# Patient Record
Sex: Male | Born: 1990 | Race: White | Hispanic: No | Marital: Single | Smoking: Current every day smoker
Health system: Southern US, Community
[De-identification: ages and names within clinical notes are randomized; demographics above are authoritative.]

## PROBLEM LIST (undated history)

## (undated) DIAGNOSIS — R011 Cardiac murmur, unspecified: Secondary | ICD-10-CM

## (undated) DIAGNOSIS — F1910 Other psychoactive substance abuse, uncomplicated: Secondary | ICD-10-CM

## (undated) DIAGNOSIS — Z720 Tobacco use: Secondary | ICD-10-CM

## (undated) DIAGNOSIS — S81801A Unspecified open wound, right lower leg, initial encounter: Secondary | ICD-10-CM

## (undated) HISTORY — PX: NO PAST SURGERIES: SHX2092

---
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
## 2011-07-14 ENCOUNTER — Encounter (HOSPITAL_COMMUNITY): Payer: Self-pay | Admitting: Emergency Medicine

## 2011-07-14 ENCOUNTER — Emergency Department (HOSPITAL_COMMUNITY)
Admission: EM | Admit: 2011-07-14 | Discharge: 2011-07-14 | Disposition: A | Discharge status: Home / Self Care | Payer: Self-pay | Attending: Emergency Medicine | Admitting: Emergency Medicine

## 2011-07-14 DIAGNOSIS — L0291 Cutaneous abscess, unspecified: Secondary | ICD-10-CM

## 2011-07-14 DIAGNOSIS — F17200 Nicotine dependence, unspecified, uncomplicated: Secondary | ICD-10-CM | POA: Insufficient documentation

## 2011-07-14 DIAGNOSIS — IMO0002 Reserved for concepts with insufficient information to code with codable children: Secondary | ICD-10-CM | POA: Insufficient documentation

## 2011-07-14 MED ORDER — SULFAMETHOXAZOLE-TRIMETHOPRIM 800-160 MG PO TABS: 1.0000 | ORAL_TABLET | Freq: Two times a day (BID) | ORAL | Status: DC

## 2011-07-14 MED ORDER — SULFAMETHOXAZOLE-TMP DS 800-160 MG PO TABS
1.0000 | ORAL_TABLET | Freq: Once | ORAL | Status: AC
  Administered 2011-07-14: 1 via ORAL
  Filled 2011-07-14: qty 1

## 2011-07-14 MED ORDER — OXYCODONE-ACETAMINOPHEN 5-325 MG PO TABS
1.0000 | ORAL_TABLET | Freq: Once | ORAL | Status: AC
  Administered 2011-07-14: 1 via ORAL
  Filled 2011-07-14: qty 1

## 2011-07-14 MED ORDER — OXYCODONE-ACETAMINOPHEN 5-325 MG PO TABS: 1.0000 | ORAL_TABLET | Freq: Four times a day (QID) | ORAL | Status: DC | PRN

## 2011-07-14 NOTE — ED Notes (Addendum)
Pt c/o abscess to left forearm onset Monday. Pt took his sister's penicillin yesterday. Pt also reports using a thumb tack and poked a hole in the boil. Area red in color with yellow center.

## 2011-07-14 NOTE — ED Provider Notes (Signed)
History  Scribed for Ethelda Chick, MD, the patient was seen in room TR10C/TR10C. This chart was scribed by Candelaria Stagers. The patient's care started at 3:58 PM   CSN: [id number]  Arrival date & time 07/14/11  1534   First MD Initiated Contact with Patient 07/14/11 1555      Chief Complaint  Patient presents with  . Abscess    left arm     The history is provided by the patient.   Rick Taylor is a 21 y.o. male who presents to the Emergency Department complaining of an abscess to his left forearmthat he noticed about five days ago and has gotten worse over the last two days.  Pt poked a hole in the abscess and covered with a band aid.  He is experiencing no other sx.     History reviewed. No pertinent past medical history.  History reviewed. No pertinent past surgical history.  History reviewed. No pertinent family history.  History  Substance Use Topics  . Smoking status: Current Everyday Smoker  . Smokeless tobacco: Not on file  . Alcohol Use: Yes      Review of Systems  Skin:       Abscess to the left elbow  All other systems reviewed and are negative.    Allergies  Review of patient's allergies indicates no known allergies.  Home Medications   Current Outpatient Rx  Name Route Sig Dispense Refill  . PENICILLIN V POTASSIUM 500 MG PO TABS Oral Take 500 mg by mouth 2 (two) times daily.    . OXYCODONE-ACETAMINOPHEN 5-325 MG PO TABS Oral Take 1-2 tablets by mouth every 6 (six) hours as needed for pain. 15 tablet 0  . SULFAMETHOXAZOLE-TRIMETHOPRIM 800-160 MG PO TABS Oral Take 1 tablet by mouth 2 (two) times daily. 28 tablet 0    BP 129/66  Pulse 71  Temp 98.5 F (36.9 C) (Oral)  Resp 18  SpO2 98%  Physical Exam  Nursing note and vitals reviewed. Constitutional: He is oriented to person, place, and time. He appears well-nourished.  HENT:  Head: Normocephalic and atraumatic.  Eyes: Pupils are equal, round, and reactive to light.  Pulmonary/Chest:  Effort normal.  Musculoskeletal: Normal range of motion. He exhibits no tenderness.  Neurological: He is alert and oriented to person, place, and time.  Skin: Skin is warm and dry.       2 cm area of erythema and induration with a central area of active drainage of purulent discharge on the left elbow.  Non fluctuant to palpation.    Psychiatric: He has a normal mood and affect. His behavior is normal.    ED Course  Procedures  DIAGNOSTIC STUDIES: Oxygen Saturation is 98% on room air, normal by my interpretation.    COORDINATION OF CARE:     Labs Reviewed - No data to display No results found.   1. Abscess       MDM  Pt presents with left forearm abscess which is draining prurulent fluid.  Area is firm and nonfluctuant surrounding.  I do not think I and D will be helpful at this time.  Pt was started on antibiotics, was instructed about warm soaks and advised to have area rechecked in 48 hours.  He is agreeable with this plan and did not want to proceed with I and D at this time.  Discharged with strict return precautions.  Pt agreeable with plan.   I personally performed the services described in this documentation, which  was scribed in my presence. The recorded information has been reviewed and considered.    Ethelda Chick, MD 07/15/11 1331

## 2011-07-24 ENCOUNTER — Emergency Department (HOSPITAL_COMMUNITY)
Admission: EM | Admit: 2011-07-24 | Discharge: 2011-07-24 | Disposition: A | Discharge status: Home / Self Care | Payer: Self-pay | Attending: Emergency Medicine | Admitting: Emergency Medicine

## 2011-07-24 ENCOUNTER — Encounter (HOSPITAL_COMMUNITY): Payer: Self-pay | Admitting: *Deleted

## 2011-07-24 DIAGNOSIS — L270 Generalized skin eruption due to drugs and medicaments taken internally: Secondary | ICD-10-CM | POA: Insufficient documentation

## 2011-07-24 DIAGNOSIS — R21 Rash and other nonspecific skin eruption: Secondary | ICD-10-CM

## 2011-07-24 DIAGNOSIS — F17200 Nicotine dependence, unspecified, uncomplicated: Secondary | ICD-10-CM | POA: Insufficient documentation

## 2011-07-24 DIAGNOSIS — IMO0002 Reserved for concepts with insufficient information to code with codable children: Secondary | ICD-10-CM | POA: Insufficient documentation

## 2011-07-24 NOTE — ED Notes (Signed)
PT has all over red rash that burns and has left posterior forearm abscess that just needs to be rechecked

## 2011-07-24 NOTE — ED Provider Notes (Signed)
History  This chart was scribed for No att. providers found by [facility] Day. This patient was seen in room TR07C/TR07C and the patient's care was started at 1353.   CSN: [id number]  Arrival date & time 07/24/11  1353   None     Chief Complaint  Patient presents with  . Rash  . Abscess    left arm    Patient is a 21 y.o. male presenting with abscess. The history is provided by the patient. No language interpreter was used.  Abscess    Rick Taylor is a 21 y.o. male who presents to the Emergency Department complaining of a red/itchy rash scattered all over his body that burns for one day. He states that he also has a previous abscess from 10 days ago on his left forearm that needs to be rechecked. He denies any SOB, or problems breathing as associated symptoms. He started antibiotics 10 days ago for abscess and almost done. Treated rash with ibuprofen and Benadryl, without relief History reviewed. No pertinent past medical history. Past medical history negative History reviewed. No pertinent past surgical history.  No family history on file.  History  Substance Use Topics  . Smoking status: Current Everyday Smoker  . Smokeless tobacco: Not on file  . Alcohol Use: Yes     occ      Review of Systems  Constitutional: Negative.   HENT: Negative.   Respiratory: Negative.   Cardiovascular: Negative.   Gastrointestinal: Negative.   Musculoskeletal: Negative.   Skin: Positive for rash (He states burning rash all over his body) and wound.  Neurological: Negative.   Hematological: Negative.   Psychiatric/Behavioral: Negative.     Allergies  Review of patient's allergies indicates no known allergies.  Home Medications   Current Outpatient Rx  Name Route Sig Dispense Refill  . OXYCODONE-ACETAMINOPHEN 5-325 MG PO TABS Oral Take 1-2 tablets by mouth every 6 (six) hours as needed for pain. 15 tablet 0  . PENICILLIN V POTASSIUM 500 MG PO TABS Oral Take 500 mg by mouth 2 (two)  times daily.    . SULFAMETHOXAZOLE-TRIMETHOPRIM 800-160 MG PO TABS Oral Take 1 tablet by mouth 2 (two) times daily. 28 tablet 0    Triage Vitals: BP 121/77  Pulse 89  Temp 98.8 F (37.1 C) (Oral)  Resp 18  SpO2 98%  Physical Exam  Nursing note and vitals reviewed. Constitutional: He is oriented to person, place, and time. He appears well-developed and well-nourished.  HENT:  Head: Normocephalic and atraumatic.  Eyes: Conjunctivae are normal. Pupils are equal, round, and reactive to light.  Neck: Neck supple. No tracheal deviation present. No thyromegaly present.  Cardiovascular: Normal rate and regular rhythm.   No murmur heard. Pulmonary/Chest: Effort normal and breath sounds normal.  Abdominal: Soft. Bowel sounds are normal. He exhibits no distension. There is no tenderness.  Musculoskeletal: Normal range of motion. He exhibits no edema and no tenderness.  Neurological: He is alert and oriented to person, place, and time. Coordination normal.  Skin: Skin is warm and dry. Rash noted.       Very faint pinkish morbilliform rash on bilateral arms and trunk not involving palms or soles. Rash is pruritic and burning no open lesions except for 2 mm open wound at left elbow where abscess was drained previously  Psychiatric: He has a normal mood and affect.    ED Course  Procedures (including critical care time) DIAGNOSTIC STUDIES: Oxygen Saturation is 98% on room air, normal by  my interpretation.    COORDINATION OF CARE: At 224 PM Discussed treatment plan with patient which includes stopping his antibiotic medication and possibly try Tylenol for his rash symptoms. Patient agrees.   Labs Reviewed - No data to display No results found.   No diagnosis found.    MDM  Abscess appears well healing patient states it feels steadily improve each day. Suspect drug rash. Plan stop Bactrim Diagnosis #1 healing abscess #2 drug rash  I personally performed the services described in this  documentation, which was scribed in my presence. The recorded information has been reviewed and considered.        Doug Sou, MD 07/24/11 1433

## 2013-07-26 ENCOUNTER — Encounter (HOSPITAL_COMMUNITY): Payer: Self-pay | Admitting: Emergency Medicine

## 2013-07-26 ENCOUNTER — Emergency Department (HOSPITAL_COMMUNITY)
Admission: EM | Admit: 2013-07-26 | Discharge: 2013-07-26 | Disposition: A | Discharge status: Home / Self Care | Payer: Self-pay | Attending: Emergency Medicine | Admitting: Emergency Medicine

## 2013-07-26 DIAGNOSIS — F17200 Nicotine dependence, unspecified, uncomplicated: Secondary | ICD-10-CM | POA: Insufficient documentation

## 2013-07-26 DIAGNOSIS — J039 Acute tonsillitis, unspecified: Secondary | ICD-10-CM

## 2013-07-26 DIAGNOSIS — R4182 Altered mental status, unspecified: Secondary | ICD-10-CM | POA: Insufficient documentation

## 2013-07-26 LAB — BASIC METABOLIC PANEL
Anion gap: 13 (ref 5–15)
BUN: 10 mg/dL (ref 6–23)
CALCIUM: 9 mg/dL (ref 8.4–10.5)
CO2: 25 mEq/L (ref 19–32)
Chloride: 96 mEq/L (ref 96–112)
Creatinine, Ser: 0.99 mg/dL (ref 0.50–1.35)
GFR calc Af Amer: >90 mL/min (ref >90)
Glucose, Bld: 104 mg/dL — ABNORMAL HIGH (ref 70–99)
POTASSIUM: 3.6 meq/L — AB (ref 3.7–5.3)
SODIUM: 134 meq/L — AB (ref 137–147)

## 2013-07-26 LAB — CBC WITH DIFFERENTIAL/PLATELET
BASOS ABS: 0 10*3/uL (ref 0.0–0.1)
BASOS PCT: 0 % (ref 0–1)
EOS PCT: 0 % (ref 0–5)
Eosinophils Absolute: 0 10*3/uL (ref 0.0–0.7)
HCT: 38.4 % — ABNORMAL LOW (ref 39.0–52.0)
Hemoglobin: 13 g/dL (ref 13.0–17.0)
Lymphocytes Relative: 10 % — ABNORMAL LOW (ref 12–46)
Lymphs Abs: 1.6 10*3/uL (ref 0.7–4.0)
MCH: 29.8 pg (ref 26.0–34.0)
MCHC: 33.9 g/dL (ref 30.0–36.0)
MCV: 88.1 fL (ref 78.0–100.0)
MONO ABS: 1.9 10*3/uL — AB (ref 0.1–1.0)
MONOS PCT: 11 % (ref 3–12)
NEUTROS ABS: 13.1 10*3/uL — AB (ref 1.7–7.7)
Neutrophils Relative %: 79 % — ABNORMAL HIGH (ref 43–77)
PLATELETS: 206 10*3/uL (ref 150–400)
RBC: 4.36 MIL/uL (ref 4.22–5.81)
RDW: 13 % (ref 11.5–15.5)
WBC: 16.6 10*3/uL — AB (ref 4.0–10.5)

## 2013-07-26 LAB — MONONUCLEOSIS SCREEN: MONO SCREEN: NEGATIVE

## 2013-07-26 LAB — RAPID STREP SCREEN (MED CTR MEBANE ONLY): STREPTOCOCCUS, GROUP A SCREEN (DIRECT): NEGATIVE

## 2013-07-26 MED ORDER — SODIUM CHLORIDE 0.9 % IV SOLN: 1000.0000 mL | INTRAVENOUS | Status: DC

## 2013-07-26 MED ORDER — FENTANYL CITRATE 0.05 MG/ML IJ SOLN
25.0000 ug | Freq: Once | INTRAMUSCULAR | Status: AC
  Administered 2013-07-26: 25 ug via INTRAVENOUS
  Filled 2013-07-26: qty 2

## 2013-07-26 MED ORDER — PENICILLIN V POTASSIUM 500 MG PO TABS: 500.0000 mg | ORAL_TABLET | Freq: Four times a day (QID) | ORAL | Status: DC

## 2013-07-26 MED ORDER — CLINDAMYCIN PHOSPHATE 900 MG/50ML IV SOLN
900.0000 mg | Freq: Once | INTRAVENOUS | Status: AC
  Administered 2013-07-26: 900 mg via INTRAVENOUS
  Filled 2013-07-26: qty 50

## 2013-07-26 MED ORDER — ONDANSETRON HCL 4 MG/2ML IJ SOLN
4.0000 mg | Freq: Once | INTRAMUSCULAR | Status: AC
  Administered 2013-07-26: 4 mg via INTRAVENOUS
  Filled 2013-07-26: qty 2

## 2013-07-26 MED ORDER — SODIUM CHLORIDE 0.9 % IV SOLN
1000.0000 mL | Freq: Once | INTRAVENOUS | Status: AC
  Administered 2013-07-26: 1000 mL via INTRAVENOUS

## 2013-07-26 MED ORDER — HYDROCODONE-ACETAMINOPHEN 7.5-325 MG/15ML PO SOLN: 15.0000 mL | Freq: Four times a day (QID) | ORAL | Status: AC | PRN

## 2013-07-26 MED ORDER — DEXAMETHASONE SODIUM PHOSPHATE 10 MG/ML IJ SOLN
10.0000 mg | Freq: Once | INTRAMUSCULAR | Status: AC
  Administered 2013-07-26: 10 mg via INTRAVENOUS
  Filled 2013-07-26: qty 1

## 2013-07-26 NOTE — ED Notes (Signed)
Pt reports sore throat, body aches, and vomiting x3 days. Vomited 1 hour ago. Pain 7/10.

## 2013-07-26 NOTE — ED Provider Notes (Signed)
CSN: [id number]     Arrival date & time 07/26/13  1300 History   First MD Initiated Contact with Patient 07/26/13 1325     Chief Complaint  Patient presents with  . Generalized Body Aches  . Sore Throat     (Consider location/radiation/quality/duration/timing/severity/associated sxs/prior Treatment) HPI Patient reports this is the third day he has had a sore throat with headache and diffuse myalgias. He is unsure of fever. He also complains of bilateral ear pain when he swallows. He has had nausea and vomiting that started last night. He states he's vomited about 6 times. He's had 2 episodes of diarrhea. He has some abdominal cramping that comes and goes. He states he feels weak, dizzy and has some swelling in his neck. He denies any shortness of breath or difficulty swallowing although it hurts to swallow. He denies being around anybody else who is ill. He does not have a history of frequent throat infections.  PCP none  History reviewed. No pertinent past medical history. History reviewed. No pertinent past surgical history. History reviewed. No pertinent family history. History  Substance Use Topics  . Smoking status: Current Every Day Smoker  . Smokeless tobacco: Not on file  . Alcohol Use: Yes     Comment: occ  patient is unemployed He smokes one half to one pack per day  Review of Systems  All other systems reviewed and are negative.     Allergies  Review of patient's allergies indicates no known allergies.  Home Medications   Prior to Admission medications   Medication Sig Start Date End Date Taking? Authorizing Provider  ibuprofen (ADVIL,MOTRIN) 200 MG tablet Take 400 mg by mouth every 6 (six) hours as needed for moderate pain.   Yes Historical Provider, MD  HYDROcodone-acetaminophen (HYCET) 7.5-325 mg/15 ml solution Take 15 mLs by mouth 4 (four) times daily as needed for moderate pain. 07/26/13 07/26/14  Ward GivensIva L Chrisma Hurlock, MD  penicillin v potassium (VEETID) 500 MG tablet  Take 1 tablet (500 mg total) by mouth 4 (four) times daily. 07/26/13   Ward GivensIva L Sonakshi Rolland, MD   BP 117/77  Pulse 86  Temp(Src) 99 F (37.2 C) (Oral)  Resp 16  SpO2 99%  Vital signs normal except low-grade temp  Physical Exam  Nursing note and vitals reviewed. Constitutional: He is oriented to person, place, and time. He appears well-developed and well-nourished.  Non-toxic appearance. He does not appear ill. No distress.  HENT:  Head: Normocephalic and atraumatic.  Right Ear: Hearing, tympanic membrane, external ear and ear canal normal.  Left Ear: Hearing, tympanic membrane, external ear and ear canal normal.  Nose: Nose normal. No mucosal edema or rhinorrhea.  Mouth/Throat: Mucous membranes are normal. No dental abscesses or uvula swelling. Oropharyngeal exudate and posterior oropharyngeal erythema present.  Patient has diffuse redness  Of his tonsils and posterior pharynx with scattered exudates. His uvula is midline. There is no soft palate swelling seen. Patient's voice is normal. He is not spitting in a cup. He is in no respiratory distress.  Eyes: Conjunctivae and EOM are normal. Pupils are equal, round, and reactive to light.  Neck: Normal range of motion and full passive range of motion without pain. Neck supple.  Patient has some shotty lymphadenopathy that extends down into the supraclavicular area on the left. The are only in the cervical area proximally on the right. There are no epitrochlear nodes.  Cardiovascular: Normal rate, regular rhythm and normal heart sounds.  Exam reveals no gallop and  no friction rub.   No murmur heard. Pulmonary/Chest: Effort normal and breath sounds normal. No respiratory distress. He has no wheezes. He has no rhonchi. He has no rales. He exhibits no tenderness and no crepitus.  Abdominal: Soft. Normal appearance and bowel sounds are normal. He exhibits no distension. There is no tenderness. There is no rebound and no guarding.  Musculoskeletal: Normal  range of motion. He exhibits no edema and no tenderness.  Moves all extremities well.   Neurological: He is alert and oriented to person, place, and time. He has normal strength. No cranial nerve deficit.  Skin: Skin is warm, dry and intact. No rash noted. No erythema. No pallor.  Psychiatric: He has a normal mood and affect. His speech is normal and behavior is normal. His mood appears not anxious.    ED Course  Procedures (including critical care time)  Medications  0.9 %  sodium chloride infusion (1,000 mLs Intravenous New Bag/Given 07/26/13 1455)    Followed by  0.9 %  sodium chloride infusion (0 mLs Intravenous Stopped 07/26/13 1608)    Followed by  0.9 %  sodium chloride infusion (not administered)  ondansetron (ZOFRAN) injection 4 mg (4 mg Intravenous Given 07/26/13 1455)  clindamycin (CLEOCIN) IVPB 900 mg (0 mg Intravenous Stopped 07/26/13 1525)  dexamethasone (DECADRON) injection 10 mg (10 mg Intravenous Given 07/26/13 1456)  fentaNYL (SUBLIMAZE) injection 25 mcg (25 mcg Intravenous Given 07/26/13 1455)    Patient refused to get Bicillin IM. He was given IV fluids and IV antibiotics. He was given one dose of steroids and pain and nausea medication and his IV.  Recheck at 1615 patient has received 1 L of IV fluid. He states he's feeling better. He is starting to get a urinary output.  Labs Review  Results for orders placed during the hospital encounter of 07/26/13  RAPID STREP SCREEN      Result Value Ref Range   Streptococcus, Group A Screen (Direct) NEGATIVE  NEGATIVE  CBC WITH DIFFERENTIAL      Result Value Ref Range   WBC 16.6 (*) 4.0 - 10.5 K/uL   RBC 4.36  4.22 - 5.81 MIL/uL   Hemoglobin 13.0  13.0 - 17.0 g/dL   HCT 40.9 (*) 81.1 - 91.4 %   MCV 88.1  78.0 - 100.0 fL   MCH 29.8  26.0 - 34.0 pg   MCHC 33.9  30.0 - 36.0 g/dL   RDW 78.2  95.6 - 21.3 %   Platelets 206  150 - 400 K/uL   Neutrophils Relative % 79 (*) 43 - 77 %   Neutro Abs 13.1 (*) 1.7 - 7.7 K/uL    Lymphocytes Relative 10 (*) 12 - 46 %   Lymphs Abs 1.6  0.7 - 4.0 K/uL   Monocytes Relative 11  3 - 12 %   Monocytes Absolute 1.9 (*) 0.1 - 1.0 K/uL   Eosinophils Relative 0  0 - 5 %   Eosinophils Absolute 0.0  0.0 - 0.7 K/uL   Basophils Relative 0  0 - 1 %   Basophils Absolute 0.0  0.0 - 0.1 K/uL  BASIC METABOLIC PANEL      Result Value Ref Range   Sodium 134 (*) 137 - 147 mEq/L   Potassium 3.6 (*) 3.7 - 5.3 mEq/L   Chloride 96  96 - 112 mEq/L   CO2 25  19 - 32 mEq/L   Glucose, Bld 104 (*) 70 - 99 mg/dL   BUN 10  6 - 23 mg/dL   Creatinine, Ser 1.61  0.50 - 1.35 mg/dL   Calcium 9.0  8.4 - 09.6 mg/dL   GFR calc non Af Amer >90  >90 mL/min   GFR calc Af Amer >90  >90 mL/min   Anion gap 13  5 - 15  MONONUCLEOSIS SCREEN      Result Value Ref Range   Mono Screen NEGATIVE  NEGATIVE   Laboratory interpretation all normal except leukocytosis     Imaging Review No results found.   EKG Interpretation None      MDM   Final diagnoses:  Acute tonsillitis     New Prescriptions   HYDROCODONE-ACETAMINOPHEN (HYCET) 7.5-325 MG/15 ML SOLUTION    Take 15 mLs by mouth 4 (four) times daily as needed for moderate pain.   PENICILLIN V POTASSIUM (VEETID) 500 MG TABLET    Take 1 tablet (500 mg total) by mouth 4 (four) times daily.    Plan discharge  Devoria Albe, MD, Franz Dell, MD 07/26/13 [phone]

## 2013-07-26 NOTE — Discharge Instructions (Signed)
Drink plenty of fluids. Take the antibiotics until gone. Use the hycet for severe pain. You can take ibuprofen 600 mg (you can take the liquid childrens if you can't swallow the pills) every 6 hrs for pain or fever. Once the hycet is gone you can take acetaminophen 1000 mg 4 times a day for pain or fever as needed. Return to the ED if you are unable to swallow or you start having trouble breathing.  You need to take the penicillin pills until gone.  Tonsillitis Tonsillitis is an infection of the throat that causes the tonsils to become red, tender, and swollen. Tonsils are collections of lymphoid tissue at the back of the throat. Each tonsil has crevices (crypts). Tonsils help fight nose and throat infections and keep infection from spreading to other parts of the body for the first 18 months of life.  CAUSES Sudden (acute) tonsillitis is usually caused by infection with streptococcal bacteria. Long-lasting (chronic) tonsillitis occurs when the crypts of the tonsils become filled with pieces of food and bacteria, which makes it easy for the tonsils to become repeatedly infected. SYMPTOMS  Symptoms of tonsillitis include:  A sore throat, with possible difficulty swallowing.  White patches on the tonsils.  Fever.  Tiredness.  New episodes of snoring during sleep, when you did not snore before.  Small, foul-smelling, yellowish-white pieces of material (tonsilloliths) that you occasionally cough up or spit out. The tonsilloliths can also cause you to have bad breath. DIAGNOSIS Tonsillitis can be diagnosed through a physical exam. Diagnosis can be confirmed with the results of lab tests, including a throat culture. TREATMENT  The goals of tonsillitis treatment include the reduction of the severity and duration of symptoms and prevention of associated conditions. Symptoms of tonsillitis can be improved with the use of steroids to reduce the swelling. Tonsillitis caused by bacteria can be treated with  antibiotic medicines. Usually, treatment with antibiotic medicines is started before the cause of the tonsillitis is known. However, if it is determined that the cause is not bacterial, antibiotic medicines will not treat the tonsillitis. If attacks of tonsillitis are severe and frequent, your health care provider may recommend surgery to remove the tonsils (tonsillectomy). HOME CARE INSTRUCTIONS   Rest as much as possible and get plenty of sleep.  Drink plenty of fluids. While the throat is very sore, eat soft foods or liquids, such as sherbet, soups, or instant breakfast drinks.  Eat frozen ice pops.  Gargle with a warm or cold liquid to help soothe the throat. Mix 1/4 teaspoon of salt and 1/4 teaspoon of baking soda in 8 oz of water. SEEK MEDICAL CARE IF:   Large, tender lumps develop in your neck.  A rash develops.  A green, yellow-brown, or bloody substance is coughed up.  You are unable to swallow liquids or food for 24 hours.  You notice that only one of the tonsils is swollen. SEEK IMMEDIATE MEDICAL CARE IF:   You develop any new symptoms such as vomiting, severe headache, stiff neck, chest pain, or trouble breathing or swallowing.  You have severe throat pain along with drooling or voice changes.  You have severe pain, unrelieved with recommended medications.  You are unable to fully open the mouth.  You develop redness, swelling, or severe pain anywhere in the neck.  You have a fever. MAKE SURE YOU:   Understand these instructions.  Will watch your condition.  Will get help right away if you are not doing well or get  worse. Document Released: 09/27/2004 Document Revised: 05/04/2013 Document Reviewed: 06/06/2012 [facility] Patient Information 2015 [employer] [place]. This information is not intended to replace advice given to you by your health care provider. Make sure you discuss any questions you have with your health care provider.

## 2013-07-28 LAB — CULTURE, GROUP A STREP

## 2014-10-02 ENCOUNTER — Emergency Department (HOSPITAL_COMMUNITY): Payer: Self-pay

## 2014-10-02 ENCOUNTER — Emergency Department (HOSPITAL_COMMUNITY)
Admission: EM | Admit: 2014-10-02 | Discharge: 2014-10-02 | Disposition: A | Discharge status: Home / Self Care | Payer: Self-pay | Attending: Emergency Medicine | Admitting: Emergency Medicine

## 2014-10-02 ENCOUNTER — Encounter (HOSPITAL_COMMUNITY): Payer: Self-pay | Admitting: Emergency Medicine

## 2014-10-02 DIAGNOSIS — R000 Tachycardia, unspecified: Secondary | ICD-10-CM | POA: Insufficient documentation

## 2014-10-02 DIAGNOSIS — Z720 Tobacco use: Secondary | ICD-10-CM | POA: Insufficient documentation

## 2014-10-02 DIAGNOSIS — Z792 Long term (current) use of antibiotics: Secondary | ICD-10-CM | POA: Insufficient documentation

## 2014-10-02 DIAGNOSIS — B349 Viral infection, unspecified: Secondary | ICD-10-CM | POA: Insufficient documentation

## 2014-10-02 LAB — BASIC METABOLIC PANEL
Anion gap: 5 (ref 5–15)
BUN: 10 mg/dL (ref 6–20)
CO2: 30 mmol/L (ref 22–32)
CREATININE: 0.89 mg/dL (ref 0.61–1.24)
Calcium: 9 mg/dL (ref 8.9–10.3)
Chloride: 98 mmol/L — ABNORMAL LOW (ref 101–111)
GFR calc Af Amer: >60 mL/min (ref >60)
Glucose, Bld: 113 mg/dL — ABNORMAL HIGH (ref 65–99)
Potassium: 3.3 mmol/L — ABNORMAL LOW (ref 3.5–5.1)
SODIUM: 133 mmol/L — AB (ref 135–145)

## 2014-10-02 LAB — I-STAT CG4 LACTIC ACID, ED: LACTIC ACID, VENOUS: 1.08 mmol/L (ref 0.5–2.0)

## 2014-10-02 LAB — CBC WITH DIFFERENTIAL/PLATELET
Basophils Absolute: 0 10*3/uL (ref 0.0–0.1)
Basophils Relative: 0 %
EOS ABS: 0 10*3/uL (ref 0.0–0.7)
EOS PCT: 0 %
HCT: 41 % (ref 39.0–52.0)
Hemoglobin: 13.8 g/dL (ref 13.0–17.0)
LYMPHS ABS: 0.4 10*3/uL — AB (ref 0.7–4.0)
Lymphocytes Relative: 3 %
MCH: 29.1 pg (ref 26.0–34.0)
MCHC: 33.7 g/dL (ref 30.0–36.0)
MCV: 86.5 fL (ref 78.0–100.0)
MONOS PCT: 0 %
Monocytes Absolute: 0.1 10*3/uL (ref 0.1–1.0)
Neutro Abs: 13 10*3/uL — ABNORMAL HIGH (ref 1.7–7.7)
Neutrophils Relative %: 97 %
PLATELETS: 238 10*3/uL (ref 150–400)
RBC: 4.74 MIL/uL (ref 4.22–5.81)
RDW: 13.2 % (ref 11.5–15.5)
WBC: 13.4 10*3/uL — AB (ref 4.0–10.5)

## 2014-10-02 LAB — RAPID STREP SCREEN (MED CTR MEBANE ONLY): Streptococcus, Group A Screen (Direct): NEGATIVE

## 2014-10-02 MED ORDER — ACETAMINOPHEN 500 MG PO TABS
1000.0000 mg | ORAL_TABLET | Freq: Once | ORAL | Status: AC
  Administered 2014-10-02: 1000 mg via ORAL

## 2014-10-02 MED ORDER — IBUPROFEN 800 MG PO TABS: 800.0000 mg | ORAL_TABLET | Freq: Three times a day (TID) | ORAL | Status: DC

## 2014-10-02 MED ORDER — ACETAMINOPHEN 500 MG PO TABS
ORAL_TABLET | ORAL | Status: AC
  Filled 2014-10-02: qty 2

## 2014-10-02 MED ORDER — SODIUM CHLORIDE 0.9 % IV BOLUS (SEPSIS)
1000.0000 mL | Freq: Once | INTRAVENOUS | Status: AC
  Administered 2014-10-02: 1000 mL via INTRAVENOUS

## 2014-10-02 MED ORDER — KETOROLAC TROMETHAMINE 30 MG/ML IJ SOLN
30.0000 mg | Freq: Once | INTRAMUSCULAR | Status: AC
  Administered 2014-10-02: 30 mg via INTRAVENOUS
  Filled 2014-10-02: qty 1

## 2014-10-02 MED ORDER — CETIRIZINE HCL 10 MG PO TABS: 10.0000 mg | ORAL_TABLET | Freq: Every day | ORAL | Status: DC
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
## 2014-10-02 NOTE — ED Provider Notes (Signed)
CSN: [id number]     Arrival date & time 10/02/14  0043 History   First MD Initiated Contact with Patient 10/02/14 0051     Chief Complaint  Patient presents with  . flu like symptoms      (Consider location/radiation/quality/duration/timing/severity/associated sxs/prior Treatment) HPI Comments: Patient is a 24 year old male who presents with multiple complaints at this time. He reports having gradual onset of body aches, subjective fever, chills, and sore throat that started today. Patient tried benadryl for his symptoms which provided no relief. No aggravating/alleviating factors. No other associated symptoms. 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   Patient is a 24 y.o. male presenting with pharyngitis. The history is provided by the patient. No language interpreter was used.  Sore Throat This is a new problem. The current episode started today. The problem occurs constantly. The problem has been unchanged. Associated symptoms include chills, a fever, myalgias and a sore throat. Nothing aggravates the symptoms. He has tried nothing for the symptoms. The treatment provided no relief.    History reviewed. No pertinent past medical history. History reviewed. No pertinent past surgical history. No family history on file. Social History  Substance Use Topics  . Smoking status: Current Every Day Smoker  . Smokeless tobacco: None  . Alcohol Use: Yes     Comment: occ    Review of Systems  Constitutional: Positive for fever and chills.  HENT: Positive for sore throat.   Musculoskeletal: Positive for myalgias.  All other systems reviewed and are negative.     Allergies  Review of patient's allergies indicates no known allergies.  Home Medications   Prior to Admission medications   Medication Sig Start Date End Date Taking? Authorizing Provider  ibuprofen (ADVIL,MOTRIN) 200 MG tablet Take 400 mg by mouth every 6 (six) hours as needed for moderate pain.     Historical Provider, MD  penicillin v potassium (VEETID) 500 MG tablet Take 1 tablet (500 mg total) by mouth 4 (four) times daily. 07/26/13   Devoria Albe, MD   BP 134/80 mmHg  Pulse 114  Temp(Src) 101.5 F (38.6 C) (Oral)  Resp 20  SpO2 98% Physical Exam  Constitutional: He is oriented to person, place, and time. He appears well-developed and well-nourished. No distress.  HENT:  Head: Normocephalic and atraumatic.  Posterior oropharynx erythematous without tonsillar edema or exudate.   Eyes: Conjunctivae and EOM are normal.  Neck: Normal range of motion.  Cardiovascular: Regular rhythm.  Exam reveals no gallop and no friction rub.   No murmur heard. tachycardic  Pulmonary/Chest: Effort normal and breath sounds normal. He has no wheezes. He has no rales. He exhibits no tenderness.  Abdominal: Soft. He exhibits no distension. There is no tenderness. There is no rebound.  Musculoskeletal: Normal range of motion.  Neurological: He is alert and oriented to person, place, and time. Coordination normal.  Speech is goal-oriented. Moves limbs without ataxia.   Skin: Skin is warm and dry.  Psychiatric: He has a normal mood and affect. His behavior is normal.  Nursing note and vitals reviewed.   ED Course  Procedures (including critical care time) Labs Review Labs Reviewed  CBC WITH DIFFERENTIAL/PLATELET - Abnormal; Notable for the following:    WBC 13.4 (*)    Neutro Abs 13.0 (*)    Lymphs Abs 0.4 (*)    All other components within normal limits  BASIC METABOLIC PANEL - Abnormal; Notable for the following:    Sodium  133 (*)    Potassium 3.3 (*)    Chloride 98 (*)    Glucose, Bld 113 (*)    All other components within normal limits  RAPID STREP SCREEN (NOT AT [facility])  CULTURE, GROUP A STREP  CULTURE, BLOOD (ROUTINE X 2)  CULTURE, BLOOD (ROUTINE X 2)  I-STAT CG4 LACTIC ACID, ED    Imaging Review Dg Chest 2 View  10/02/2014   CLINICAL DATA:  Sore throat and flu-like symptoms.   EXAM: CHEST  2 VIEW  COMPARISON:  None.  FINDINGS: Normal heart size and mediastinal contours. No acute infiltrate or edema. No effusion or pneumothorax. No acute osseous findings.  IMPRESSION: Negative chest.   Electronically Signed   By: Marnee Spring M.D.   On: 10/02/2014 01:14   I have personally reviewed and evaluated these images and lab results as part of my medical decision-making.   EKG Interpretation None      MDM   Final diagnoses:  Viral illness    2:00 AM Patient's labs show elevated WBC at 13.4. Other labs show no acute changes. Patient is febrile and tachycardic at this time. He last used heroin a few hours ago.   Patient's aunt refused outpatient resources for substance abuse because "they all cost money." patient feeling better after fluids and toradol. Patient will be discharged with ibuprofen and zyrtec. Patient likely has a viral illness.     Emilia Beck, PA-C 10/02/14 0449  Lorre Nick, MD 10/05/14 [phone]

## 2014-10-02 NOTE — Discharge Instructions (Signed)
Take ibuprofen as needed for fever and body aches. Take zyrtec as needed for congestion and sore throat. Refer to attached documents for more information.

## 2014-10-05 LAB — CULTURE, GROUP A STREP

## 2014-10-07 LAB — CULTURE, BLOOD (ROUTINE X 2)
CULTURE: NO GROWTH
Culture: NO GROWTH

## 2020-02-16 ENCOUNTER — Other Ambulatory Visit: Payer: Self-pay

## 2020-02-16 ENCOUNTER — Emergency Department (HOSPITAL_COMMUNITY)
Admission: EM | Admit: 2020-02-16 | Discharge: 2020-02-16 | Payer: Self-pay | Attending: Emergency Medicine | Admitting: Emergency Medicine

## 2020-02-16 ENCOUNTER — Emergency Department (HOSPITAL_COMMUNITY): Payer: Self-pay

## 2020-02-16 ENCOUNTER — Encounter (HOSPITAL_COMMUNITY): Payer: Self-pay | Admitting: Emergency Medicine

## 2020-02-16 DIAGNOSIS — S81801A Unspecified open wound, right lower leg, initial encounter: Secondary | ICD-10-CM | POA: Insufficient documentation

## 2020-02-16 DIAGNOSIS — Z79899 Other long term (current) drug therapy: Secondary | ICD-10-CM | POA: Insufficient documentation

## 2020-02-16 DIAGNOSIS — F17200 Nicotine dependence, unspecified, uncomplicated: Secondary | ICD-10-CM | POA: Insufficient documentation

## 2020-02-16 DIAGNOSIS — W278XXA Contact with other nonpowered hand tool, initial encounter: Secondary | ICD-10-CM | POA: Insufficient documentation

## 2020-02-16 DIAGNOSIS — Z23 Encounter for immunization: Secondary | ICD-10-CM | POA: Insufficient documentation

## 2020-02-16 LAB — AEROBIC CULTURE W GRAM STAIN (SUPERFICIAL SPECIMEN)

## 2020-02-16 LAB — CBC WITH DIFFERENTIAL/PLATELET
Abs Immature Granulocytes: 0.03 10*3/uL (ref 0.00–0.07)
Basophils Absolute: 0.1 10*3/uL (ref 0.0–0.1)
Basophils Relative: 1 %
Eosinophils Absolute: 0.1 10*3/uL (ref 0.0–0.5)
Eosinophils Relative: 1 %
HCT: 36.5 % — ABNORMAL LOW (ref 39.0–52.0)
Hemoglobin: 11.6 g/dL — ABNORMAL LOW (ref 13.0–17.0)
Immature Granulocytes: 0 %
Lymphocytes Relative: 20 %
Lymphs Abs: 1.9 10*3/uL (ref 0.7–4.0)
MCH: 27.5 pg (ref 26.0–34.0)
MCHC: 31.8 g/dL (ref 30.0–36.0)
MCV: 86.5 fL (ref 80.0–100.0)
Monocytes Absolute: 0.8 10*3/uL (ref 0.1–1.0)
Monocytes Relative: 8 %
Neutro Abs: 6.5 10*3/uL (ref 1.7–7.7)
Neutrophils Relative %: 70 %
Platelets: 599 10*3/uL — ABNORMAL HIGH (ref 150–400)
RBC: 4.22 MIL/uL (ref 4.22–5.81)
RDW: 13.4 % (ref 11.5–15.5)
WBC: 9.4 10*3/uL (ref 4.0–10.5)
nRBC: 0 % (ref 0.0–0.2)

## 2020-02-16 LAB — COMPREHENSIVE METABOLIC PANEL
ALT: 20 U/L (ref 0–44)
AST: 26 U/L (ref 15–41)
Albumin: 3.7 g/dL (ref 3.5–5.0)
Alkaline Phosphatase: 64 U/L (ref 38–126)
Anion gap: 9 (ref 5–15)
BUN: 12 mg/dL (ref 6–20)
CO2: 28 mmol/L (ref 22–32)
Calcium: 8.9 mg/dL (ref 8.9–10.3)
Chloride: 100 mmol/L (ref 98–111)
Creatinine, Ser: 0.76 mg/dL (ref 0.61–1.24)
GFR, Estimated: >60 mL/min (ref >60)
Glucose, Bld: 89 mg/dL (ref 70–99)
Potassium: 3.9 mmol/L (ref 3.5–5.1)
Sodium: 137 mmol/L (ref 135–145)
Total Bilirubin: 0.3 mg/dL (ref 0.3–1.2)
Total Protein: 8.8 g/dL — ABNORMAL HIGH (ref 6.5–8.1)

## 2020-02-16 LAB — LACTIC ACID, PLASMA: Lactic Acid, Venous: 1.1 mmol/L (ref 0.5–1.9)

## 2020-02-16 MED ORDER — CLINDAMYCIN PHOSPHATE 600 MG/4ML IJ SOLN: 600.0000 mg | Freq: Once | INTRAMUSCULAR | Status: DC

## 2020-02-16 MED ORDER — CLINDAMYCIN HCL 300 MG PO CAPS: 300.0000 mg | ORAL_CAPSULE | Freq: Four times a day (QID) | ORAL | 0 refills | Status: AC

## 2020-02-16 MED ORDER — CLINDAMYCIN PHOSPHATE 900 MG/50ML IV SOLN
900.0000 mg | Freq: Once | INTRAVENOUS | Status: AC
  Administered 2020-02-16: 900 mg via INTRAVENOUS
  Filled 2020-02-16: qty 50

## 2020-02-16 MED ORDER — TETANUS-DIPHTH-ACELL PERTUSSIS 5-2.5-18.5 LF-MCG/0.5 IM SUSY
0.5000 mL | PREFILLED_SYRINGE | Freq: Once | INTRAMUSCULAR | Status: AC
  Administered 2020-02-16: 0.5 mL via INTRAMUSCULAR
  Filled 2020-02-16: qty 0.5

## 2020-02-16 NOTE — ED Notes (Addendum)
An After Visit Summary was printed and given to the patient. Discharge instructions given and no further questions at this time.  Pt leaving with police escort. Pt given new socks.

## 2020-02-16 NOTE — ED Triage Notes (Signed)
Under police custody-states he has an old right lower leg wound as a result of patient hitting his leg with a claw hammer-states his wound is infected

## 2020-02-16 NOTE — ED Provider Notes (Signed)
[facility]-EMERGENCY DEPT Provider Note   CSN: [id number] Arrival date & time: 02/16/20  1135     History Chief Complaint  Patient presents with  . Leg Pain    Rick Taylor is a 30 y.o. male.  30 year old male with history of polysubstance abuse brought in by police with right lower leg wound.  Patient states the hit his leg with a claw hammer approximately 3 months ago, did not seek wound care at that time and has been neglecting the wound since.  Patient was picked up today after failure to appear and was brought in by police for medical attention for his right lower leg wound.  Patient reports sensation of his foot, is able to bear weight without difficulty.  No other complaints or concerns today.        History reviewed. No pertinent past medical history.  There are no problems to display for this patient.   History reviewed. No pertinent surgical history.     No family history on file.  Social History   Tobacco Use  . Smoking status: Current Every Day Smoker  Substance Use Topics  . Alcohol use: Yes    Comment: occ  . Drug use: Yes    Types: IV    Home Medications Prior to Admission medications   Medication Sig Start Date End Date Taking? Authorizing Provider  clindamycin (CLEOCIN) 300 MG capsule Take 1 capsule (300 mg total) by mouth 4 (four) times daily for 10 days. 02/16/20 02/26/20 Yes Jeannie Fend, PA-C  cetirizine (ZYRTEC ALLERGY) 10 MG tablet Take 1 tablet (10 mg total) by mouth daily. 10/02/14   Emilia Beck, PA-C  ibuprofen (ADVIL,MOTRIN) 800 MG tablet Take 1 tablet (800 mg total) by mouth 3 (three) times daily. 10/02/14   Szekalski, Yvonna Alanis, PA-C  penicillin v potassium (VEETID) 500 MG tablet Take 1 tablet (500 mg total) by mouth 4 (four) times daily. 07/26/13   Devoria Albe, MD    Allergies    Patient has no known allergies.  Review of Systems   Review of Systems  Constitutional: Negative for fever.  Musculoskeletal:  Negative for arthralgias and myalgias.  Skin: Positive for wound.  Allergic/Immunologic: Negative for immunocompromised state.  Neurological: Negative for weakness and numbness.    Physical Exam Updated Vital Signs BP 109/71   Pulse 89   Temp 98 F (36.7 C) (Oral)   Resp 18   SpO2 100%   Physical Exam Vitals and nursing note reviewed.  Constitutional:      General: He is not in acute distress.    Appearance: He is well-developed and well-nourished. He is not diaphoretic.  HENT:     Head: Normocephalic and atraumatic.  Cardiovascular:     Pulses: Normal pulses.  Pulmonary:     Effort: Pulmonary effort is normal.  Musculoskeletal:        General: Tenderness present. No deformity.  Skin:    General: Skin is warm and dry.     Comments: Large wound to anterior right lower leg approximately 10cm x 15cm  Neurological:     Mental Status: He is alert and oriented to person, place, and time.     Sensory: No sensory deficit.  Psychiatric:        Mood and Affect: Mood and affect normal.        Behavior: Behavior normal.                ED Results / Procedures / Treatments  Labs (all labs ordered are listed, but only abnormal results are displayed) Labs Reviewed  COMPREHENSIVE METABOLIC PANEL - Abnormal; Notable for the following components:      Result Value   Total Protein 8.8 (*)    All other components within normal limits  CBC WITH DIFFERENTIAL/PLATELET - Abnormal; Notable for the following components:   Hemoglobin 11.6 (*)    HCT 36.5 (*)    Platelets 599 (*)    All other components within normal limits  AEROBIC CULTURE W GRAM STAIN (SUPERFICIAL SPECIMEN)  LACTIC ACID, PLASMA  URINALYSIS, ROUTINE W REFLEX MICROSCOPIC    EKG None  Radiology DG Tibia/Fibula Right  Result Date: 02/16/2020 CLINICAL DATA:  Right lower leg wound EXAM: RIGHT TIBIA AND FIBULA - 2 VIEW COMPARISON:  None. FINDINGS: No acute bony abnormality. Specifically, no fracture,  subluxation, or dislocation. No bone destruction. No radiopaque foreign bodies. IMPRESSION: No acute bony abnormality. Electronically Signed   By: Charlett Nose M.D.   On: 02/16/2020 12:57    Procedures Procedures   Medications Ordered in ED Medications  Tdap (BOOSTRIX) injection 0.5 mL (0.5 mLs Intramuscular Given 02/16/20 1255)  clindamycin (CLEOCIN) IVPB 900 mg (0 mg Intravenous Stopped 02/16/20 1341)    ED Course  I have reviewed the triage vital signs and the nursing notes.  Pertinent labs & imaging results that were available during my care of the patient were reviewed by me and considered in my medical decision making (see chart for details).  Clinical Course as of 02/16/20 1352  Tue Feb 16, 2020  1350 30 yo male brought in by police for medical clearance for right leg wound as above. XR without bony involvement. Labs reassuring including CBC, CMP, lactic acid. Discussed with Dr. Effie Shy, ER attending, plan is to give IM abx and dc on abx. Patient will be leaving with officers today to go to jail. Rx for clindamycin printed with his papers, referral to wound care given, td updated.  [LM]    Clinical Course User Index [LM] Alden Hipp   MDM Rules/Calculators/A&P                          Final Clinical Impression(s) / ED Diagnoses Final diagnoses:  Leg wound, right, initial encounter    Rx / DC Orders ED Discharge Orders         Ordered    clindamycin (CLEOCIN) 300 MG capsule  4 times daily        02/16/20 1307           Jeannie Fend, PA-C 02/16/20 1352    Mancel Bale, MD 02/16/20 1622

## 2020-02-16 NOTE — ED Notes (Signed)
Wet to dry dressing with xeroform applied to patients wound to right calf as ordered by Army Melia, PA.

## 2020-02-16 NOTE — Discharge Instructions (Addendum)
Take antibiotics as prescribed and complete the full course. Follow-up with wound clinic, call to schedule appointment.

## 2020-02-17 LAB — AEROBIC CULTURE W GRAM STAIN (SUPERFICIAL SPECIMEN)

## 2020-02-19 LAB — AEROBIC CULTURE W GRAM STAIN (SUPERFICIAL SPECIMEN): Gram Stain: NONE SEEN

## 2020-02-20 NOTE — Progress Notes (Signed)
ED Antimicrobial Stewardship Positive Culture Follow Up   Rick Taylor is an 30 y.o. male who presented to [facility] on 02/16/2020 with a chief complaint of right leg wound. Chief Complaint  Patient presents with  . Leg Pain    Recent Results (from the past 720 hour(s))  Aerobic Culture w Gram Stain (superficial specimen)     Status: None   Collection Time: 02/16/20 12:47 PM   Specimen: Leg; Wound  Result Value Ref Range Status   Specimen Description   Final    LEG RIGHT Performed at [facility], [address], [place], [place] [address]    Special Requests   Final    NONE Performed at [facility], [address] [place], [place] [address]    Gram Stain   Final    NO WBC SEEN MODERATE GRAM POSITIVE COCCI FEW GRAM NEGATIVE RODS RARE GRAM POSITIVE RODS Performed at [facility], [address], [place], [place] [address]    Culture RARE PSEUDOMONAS AERUGINOSA  Final   Report Status 02/19/2020 FINAL  Final   Organism ID, Bacteria PSEUDOMONAS AERUGINOSA  Final      Susceptibility   Pseudomonas aeruginosa - MIC*    CEFTAZIDIME <=1 SENSITIVE Sensitive     CIPROFLOXACIN <=0.25 SENSITIVE Sensitive     GENTAMICIN <=1 SENSITIVE Sensitive     IMIPENEM 2 SENSITIVE Sensitive     PIP/TAZO <=4 SENSITIVE Sensitive     CEFEPIME 1 SENSITIVE Sensitive     * RARE PSEUDOMONAS AERUGINOSA    Plan: - start ciprofloxacin 500mg  PO bid x10 days  ED Provider: , PA-C   Dietrich Pates 02/20/2020, 11:17 AM Clinical Pharmacist [phone]

## 2020-02-21 ENCOUNTER — Telehealth (HOSPITAL_BASED_OUTPATIENT_CLINIC_OR_DEPARTMENT_OTHER): Payer: Self-pay | Admitting: Emergency Medicine

## 2020-02-21 NOTE — Telephone Encounter (Signed)
Post ED Visit - Positive Culture Follow-up: Unsuccessful Patient Follow-up  Culture assessed and recommendations reviewed by:  []  , Pharm.D. []  Enzo Bi, Pharm.D., BCPS AQ-ID []  , Pharm.D., BCPS []  Celedonio Miyamoto, Pharm.D., BCPS []  [place], Garvin Fila.D., BCPS, AAHIVP []  , Pharm.D., BCPS, AAHIVP [x]  Georgina Pillion, PharmD []  , PharmD, BCPS  Positive wound culture  []  Patient discharged without antimicrobial prescription and treatment is now indicated []  Organism is resistant to prescribed ED discharge antimicrobial []  Patient with positive blood cultures   Unable to contact patient @ phone number on file, left voicemail x 2 on mobile number and emergency contact number on file,no address on file to send letter.  Plan: Cipro 500 mg PO BID x ten days. Hina Khatri PA   [place] 02/21/2020, 5:39 PM

## 2021-07-12 IMAGING — DX DG TIBIA/FIBULA 2V*R*
2 series · 2 of 2 positions shown · non-contrast
Comparison: None.

CLINICAL DATA: Right lower leg wound

EXAM:
RIGHT TIBIA AND FIBULA - 2 VIEW

[tibia ap (1 of 2)]
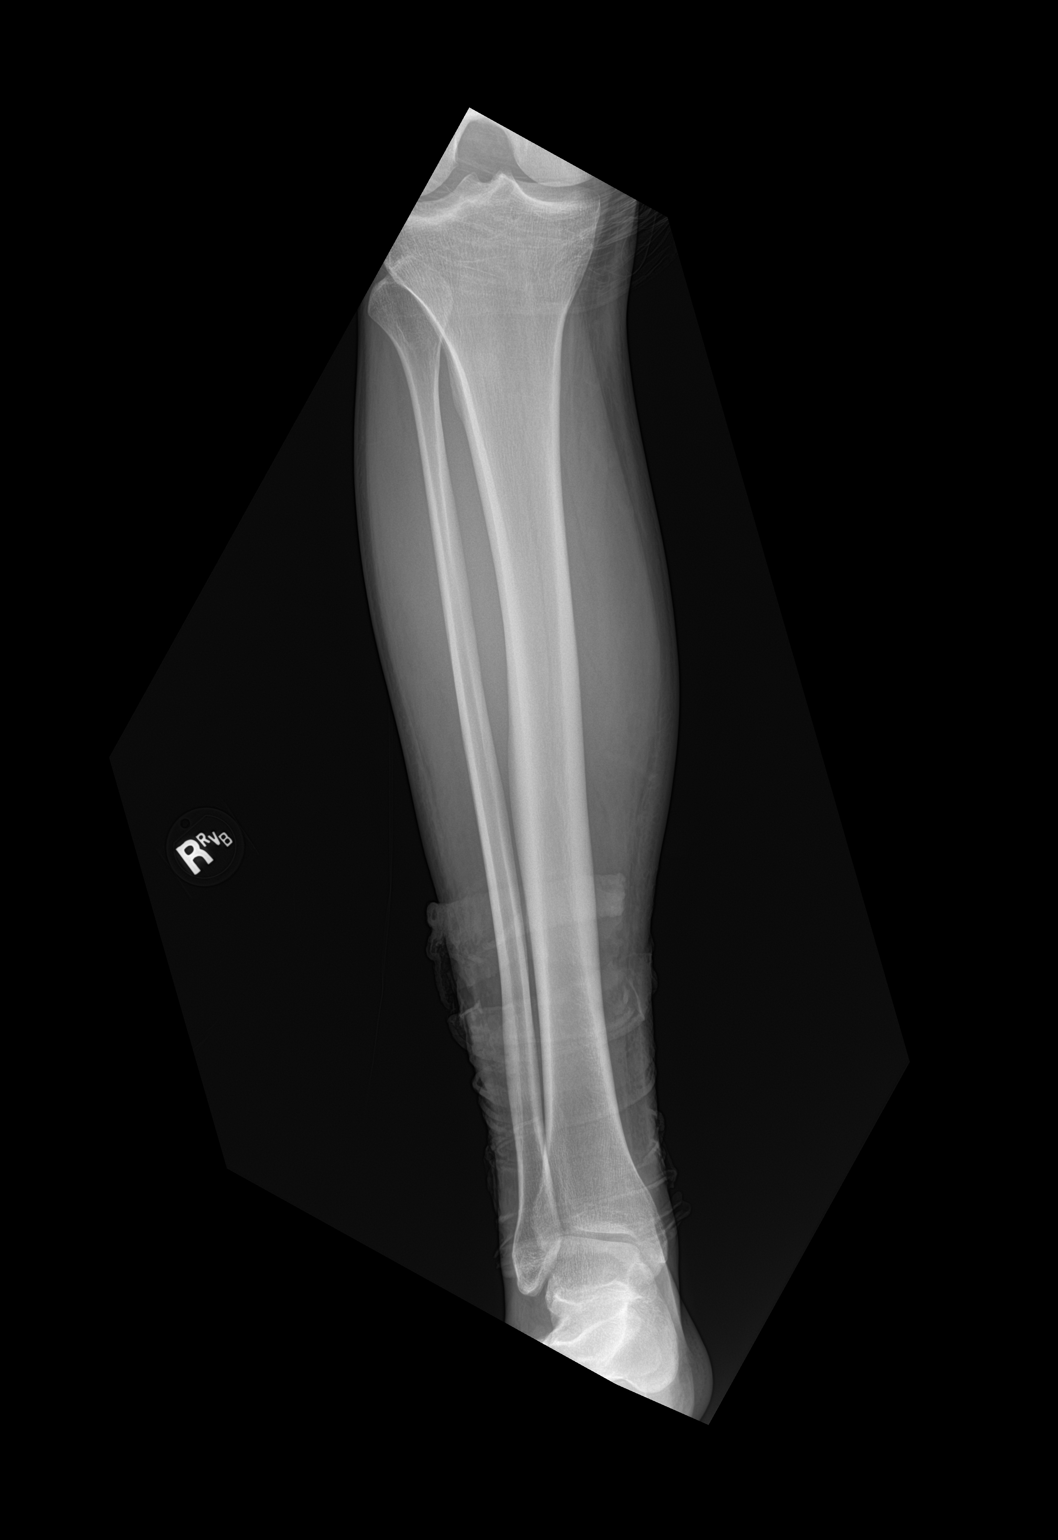

[tibia ap (2 of 2)]
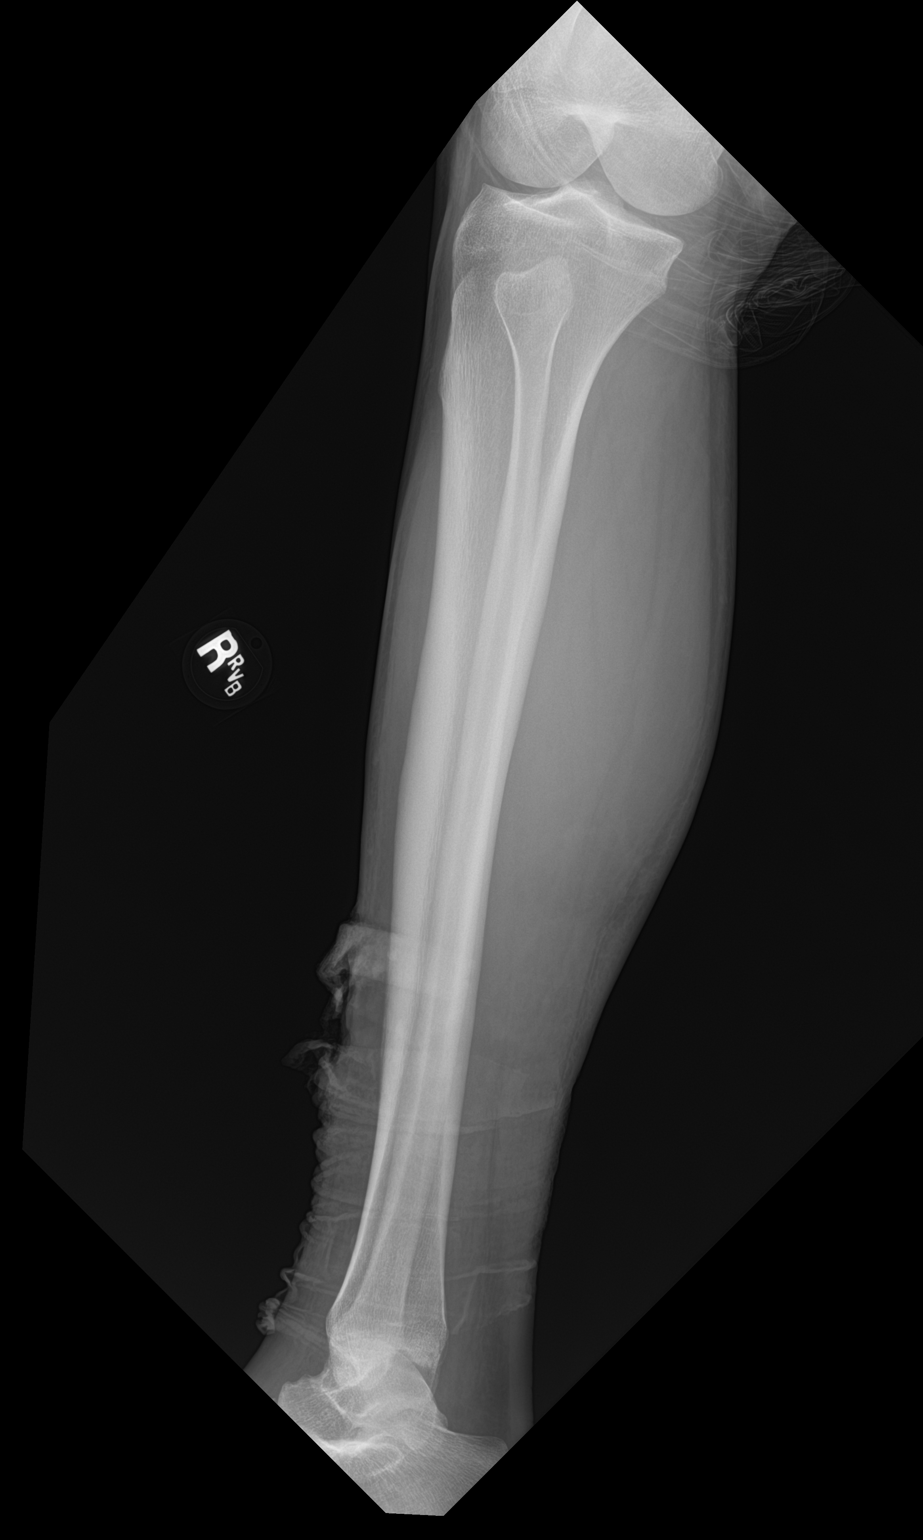

[2 of 2 positions shown; findings below may reference images not displayed]

FINDINGS: No acute bony abnormality. Specifically, no fracture, subluxation,
or dislocation. No bone destruction. No radiopaque foreign bodies.
IMPRESSION: No acute bony abnormality.

## 2022-10-30 ENCOUNTER — Inpatient Hospital Stay (HOSPITAL_COMMUNITY)
Admission: EM | Admit: 2022-10-30 | Discharge: 2022-12-02 | DRG: 871 | Disposition: E | Payer: Self-pay | Attending: Internal Medicine | Admitting: Internal Medicine

## 2022-10-30 ENCOUNTER — Other Ambulatory Visit: Payer: Self-pay

## 2022-10-30 DIAGNOSIS — Z5941 Food insecurity: Secondary | ICD-10-CM

## 2022-10-30 DIAGNOSIS — A419 Sepsis, unspecified organism: Secondary | ICD-10-CM | POA: Diagnosis present

## 2022-10-30 DIAGNOSIS — R109 Unspecified abdominal pain: Secondary | ICD-10-CM | POA: Diagnosis present

## 2022-10-30 DIAGNOSIS — S81801A Unspecified open wound, right lower leg, initial encounter: Secondary | ICD-10-CM | POA: Diagnosis present

## 2022-10-30 DIAGNOSIS — D509 Iron deficiency anemia, unspecified: Secondary | ICD-10-CM | POA: Diagnosis present

## 2022-10-30 DIAGNOSIS — I5033 Acute on chronic diastolic (congestive) heart failure: Secondary | ICD-10-CM | POA: Diagnosis present

## 2022-10-30 DIAGNOSIS — I080 Rheumatic disorders of both mitral and aortic valves: Secondary | ICD-10-CM | POA: Diagnosis present

## 2022-10-30 DIAGNOSIS — X58XXXA Exposure to other specified factors, initial encounter: Secondary | ICD-10-CM | POA: Diagnosis present

## 2022-10-30 DIAGNOSIS — R739 Hyperglycemia, unspecified: Secondary | ICD-10-CM | POA: Diagnosis present

## 2022-10-30 DIAGNOSIS — Z5900 Homelessness unspecified: Secondary | ICD-10-CM

## 2022-10-30 DIAGNOSIS — R6520 Severe sepsis without septic shock: Secondary | ICD-10-CM | POA: Diagnosis present

## 2022-10-30 DIAGNOSIS — D638 Anemia in other chronic diseases classified elsewhere: Secondary | ICD-10-CM | POA: Diagnosis present

## 2022-10-30 DIAGNOSIS — Z6820 Body mass index (BMI) 20.0-20.9, adult: Secondary | ICD-10-CM

## 2022-10-30 DIAGNOSIS — E876 Hypokalemia: Secondary | ICD-10-CM | POA: Diagnosis not present

## 2022-10-30 DIAGNOSIS — F1193 Opioid use, unspecified with withdrawal: Secondary | ICD-10-CM | POA: Diagnosis present

## 2022-10-30 DIAGNOSIS — S8990XA Unspecified injury of unspecified lower leg, initial encounter: Secondary | ICD-10-CM | POA: Diagnosis present

## 2022-10-30 DIAGNOSIS — E872 Acidosis, unspecified: Secondary | ICD-10-CM | POA: Diagnosis present

## 2022-10-30 DIAGNOSIS — Z515 Encounter for palliative care: Secondary | ICD-10-CM

## 2022-10-30 DIAGNOSIS — Z79899 Other long term (current) drug therapy: Secondary | ICD-10-CM

## 2022-10-30 DIAGNOSIS — A4181 Sepsis due to Enterococcus: Principal | ICD-10-CM | POA: Diagnosis present

## 2022-10-30 DIAGNOSIS — R64 Cachexia: Secondary | ICD-10-CM | POA: Diagnosis present

## 2022-10-30 DIAGNOSIS — E43 Unspecified severe protein-calorie malnutrition: Secondary | ICD-10-CM | POA: Diagnosis present

## 2022-10-30 DIAGNOSIS — D696 Thrombocytopenia, unspecified: Secondary | ICD-10-CM | POA: Diagnosis present

## 2022-10-30 DIAGNOSIS — K5900 Constipation, unspecified: Secondary | ICD-10-CM | POA: Diagnosis not present

## 2022-10-30 DIAGNOSIS — Z716 Tobacco abuse counseling: Secondary | ICD-10-CM

## 2022-10-30 DIAGNOSIS — Z5982 Transportation insecurity: Secondary | ICD-10-CM

## 2022-10-30 DIAGNOSIS — L270 Generalized skin eruption due to drugs and medicaments taken internally: Secondary | ICD-10-CM | POA: Diagnosis not present

## 2022-10-30 DIAGNOSIS — Z720 Tobacco use: Secondary | ICD-10-CM | POA: Diagnosis present

## 2022-10-30 DIAGNOSIS — E871 Hypo-osmolality and hyponatremia: Secondary | ICD-10-CM | POA: Diagnosis present

## 2022-10-30 DIAGNOSIS — Z22322 Carrier or suspected carrier of Methicillin resistant Staphylococcus aureus: Secondary | ICD-10-CM

## 2022-10-30 DIAGNOSIS — L03115 Cellulitis of right lower limb: Principal | ICD-10-CM | POA: Diagnosis present

## 2022-10-30 DIAGNOSIS — F1123 Opioid dependence with withdrawal: Secondary | ICD-10-CM | POA: Diagnosis not present

## 2022-10-30 DIAGNOSIS — E440 Moderate protein-calorie malnutrition: Secondary | ICD-10-CM | POA: Diagnosis present

## 2022-10-30 DIAGNOSIS — R791 Abnormal coagulation profile: Secondary | ICD-10-CM | POA: Diagnosis present

## 2022-10-30 DIAGNOSIS — D735 Infarction of spleen: Secondary | ICD-10-CM | POA: Diagnosis not present

## 2022-10-30 DIAGNOSIS — F17210 Nicotine dependence, cigarettes, uncomplicated: Secondary | ICD-10-CM | POA: Diagnosis present

## 2022-10-30 DIAGNOSIS — N280 Ischemia and infarction of kidney: Secondary | ICD-10-CM | POA: Diagnosis not present

## 2022-10-30 DIAGNOSIS — T360X5A Adverse effect of penicillins, initial encounter: Secondary | ICD-10-CM | POA: Diagnosis not present

## 2022-10-30 DIAGNOSIS — I330 Acute and subacute infective endocarditis: Secondary | ICD-10-CM | POA: Diagnosis present

## 2022-10-30 DIAGNOSIS — Z23 Encounter for immunization: Secondary | ICD-10-CM

## 2022-10-30 DIAGNOSIS — Z66 Do not resuscitate: Secondary | ICD-10-CM | POA: Diagnosis not present

## 2022-10-30 DIAGNOSIS — R7989 Other specified abnormal findings of blood chemistry: Secondary | ICD-10-CM | POA: Diagnosis present

## 2022-10-30 DIAGNOSIS — F419 Anxiety disorder, unspecified: Secondary | ICD-10-CM | POA: Diagnosis present

## 2022-10-30 HISTORY — DX: Unspecified open wound, right lower leg, initial encounter: S81.801A

## 2022-10-30 HISTORY — DX: Tobacco use: Z72.0

## 2022-10-30 HISTORY — DX: Other psychoactive substance abuse, uncomplicated: F19.10

## 2022-10-30 HISTORY — DX: Cardiac murmur, unspecified: R01.1

## 2022-10-30 NOTE — ED Triage Notes (Signed)
Pt presents via POV c/o right leg pain. Reports right leg infected. Reports IV drug abuse. Also reports SOB. Reports unhoused at this time. Pt speaking in completed sentences at this time.

## 2022-10-31 ENCOUNTER — Inpatient Hospital Stay (HOSPITAL_COMMUNITY): Payer: Self-pay

## 2022-10-31 ENCOUNTER — Emergency Department (HOSPITAL_COMMUNITY): Payer: Self-pay

## 2022-10-31 ENCOUNTER — Encounter (HOSPITAL_COMMUNITY): Payer: Self-pay | Admitting: Internal Medicine

## 2022-10-31 DIAGNOSIS — A419 Sepsis, unspecified organism: Secondary | ICD-10-CM | POA: Diagnosis present

## 2022-10-31 DIAGNOSIS — L03115 Cellulitis of right lower limb: Principal | ICD-10-CM | POA: Diagnosis present

## 2022-10-31 DIAGNOSIS — L0390 Cellulitis, unspecified: Secondary | ICD-10-CM

## 2022-10-31 DIAGNOSIS — S8990XA Unspecified injury of unspecified lower leg, initial encounter: Secondary | ICD-10-CM | POA: Diagnosis present

## 2022-10-31 DIAGNOSIS — I70261 Atherosclerosis of native arteries of extremities with gangrene, right leg: Secondary | ICD-10-CM

## 2022-10-31 DIAGNOSIS — Z720 Tobacco use: Secondary | ICD-10-CM | POA: Diagnosis present

## 2022-10-31 DIAGNOSIS — E43 Unspecified severe protein-calorie malnutrition: Secondary | ICD-10-CM | POA: Diagnosis present

## 2022-10-31 DIAGNOSIS — D509 Iron deficiency anemia, unspecified: Secondary | ICD-10-CM | POA: Diagnosis present

## 2022-10-31 DIAGNOSIS — E440 Moderate protein-calorie malnutrition: Secondary | ICD-10-CM | POA: Diagnosis present

## 2022-10-31 DIAGNOSIS — R7989 Other specified abnormal findings of blood chemistry: Secondary | ICD-10-CM | POA: Diagnosis present

## 2022-10-31 DIAGNOSIS — F1193 Opioid use, unspecified with withdrawal: Secondary | ICD-10-CM | POA: Diagnosis present

## 2022-10-31 DIAGNOSIS — R739 Hyperglycemia, unspecified: Secondary | ICD-10-CM | POA: Diagnosis present

## 2022-10-31 DIAGNOSIS — E871 Hypo-osmolality and hyponatremia: Secondary | ICD-10-CM | POA: Diagnosis present

## 2022-10-31 LAB — CBC
HCT: 25.4 % — ABNORMAL LOW (ref 39.0–52.0)
Hemoglobin: 7.5 g/dL — ABNORMAL LOW (ref 13.0–17.0)
MCH: 23.9 pg — ABNORMAL LOW (ref 26.0–34.0)
MCHC: 29.5 g/dL — ABNORMAL LOW (ref 30.0–36.0)
MCV: 80.9 fL (ref 80.0–100.0)
Platelets: 139 10*3/uL — ABNORMAL LOW (ref 150–400)
RBC: 3.14 MIL/uL — ABNORMAL LOW (ref 4.22–5.81)
RDW: 19.2 % — ABNORMAL HIGH (ref 11.5–15.5)
WBC: 15.8 10*3/uL — ABNORMAL HIGH (ref 4.0–10.5)
nRBC: 0.1 % (ref 0.0–0.2)

## 2022-10-31 LAB — COMPREHENSIVE METABOLIC PANEL
ALT: 130 U/L — ABNORMAL HIGH (ref 0–44)
ALT: 163 U/L — ABNORMAL HIGH (ref 0–44)
AST: 70 U/L — ABNORMAL HIGH (ref 15–41)
AST: 97 U/L — ABNORMAL HIGH (ref 15–41)
Albumin: 2.1 g/dL — ABNORMAL LOW (ref 3.5–5.0)
Albumin: 2.3 g/dL — ABNORMAL LOW (ref 3.5–5.0)
Alkaline Phosphatase: 143 U/L — ABNORMAL HIGH (ref 38–126)
Alkaline Phosphatase: 160 U/L — ABNORMAL HIGH (ref 38–126)
Anion gap: 7 (ref 5–15)
Anion gap: 8 (ref 5–15)
BUN: 30 mg/dL — ABNORMAL HIGH (ref 6–20)
BUN: 40 mg/dL — ABNORMAL HIGH (ref 6–20)
CO2: 21 mmol/L — ABNORMAL LOW (ref 22–32)
CO2: 22 mmol/L (ref 22–32)
Calcium: 7.5 mg/dL — ABNORMAL LOW (ref 8.9–10.3)
Calcium: 7.7 mg/dL — ABNORMAL LOW (ref 8.9–10.3)
Chloride: 96 mmol/L — ABNORMAL LOW (ref 98–111)
Chloride: 99 mmol/L (ref 98–111)
Creatinine, Ser: 0.69 mg/dL (ref 0.61–1.24)
Creatinine, Ser: 0.93 mg/dL (ref 0.61–1.24)
GFR, Estimated: >60 mL/min (ref >60)
GFR, Estimated: >60 mL/min (ref >60)
Glucose, Bld: 108 mg/dL — ABNORMAL HIGH (ref 70–99)
Glucose, Bld: 110 mg/dL — ABNORMAL HIGH (ref 70–99)
Potassium: 4.4 mmol/L (ref 3.5–5.1)
Potassium: 4.7 mmol/L (ref 3.5–5.1)
Sodium: 125 mmol/L — ABNORMAL LOW (ref 135–145)
Sodium: 128 mmol/L — ABNORMAL LOW (ref 135–145)
Total Bilirubin: 0.8 mg/dL (ref 0.3–1.2)
Total Bilirubin: 1 mg/dL (ref 0.3–1.2)
Total Protein: 6.1 g/dL — ABNORMAL LOW (ref 6.5–8.1)
Total Protein: 6.6 g/dL (ref 6.5–8.1)

## 2022-10-31 LAB — BLOOD CULTURE ID PANEL (REFLEXED) - BCID2

## 2022-10-31 LAB — CBC WITH DIFFERENTIAL/PLATELET
Abs Immature Granulocytes: 0.12 10*3/uL — ABNORMAL HIGH (ref 0.00–0.07)
Basophils Absolute: 0 10*3/uL (ref 0.0–0.1)
Basophils Relative: 0 %
Eosinophils Absolute: 0 10*3/uL (ref 0.0–0.5)
Eosinophils Relative: 0 %
HCT: 27 % — ABNORMAL LOW (ref 39.0–52.0)
Hemoglobin: 8.2 g/dL — ABNORMAL LOW (ref 13.0–17.0)
Immature Granulocytes: 1 %
Lymphocytes Relative: 7 %
Lymphs Abs: 1.1 10*3/uL (ref 0.7–4.0)
MCH: 24 pg — ABNORMAL LOW (ref 26.0–34.0)
MCHC: 30.4 g/dL (ref 30.0–36.0)
MCV: 79.2 fL — ABNORMAL LOW (ref 80.0–100.0)
Monocytes Absolute: 0.4 10*3/uL (ref 0.1–1.0)
Monocytes Relative: 3 %
Neutro Abs: 13.6 10*3/uL — ABNORMAL HIGH (ref 1.7–7.7)
Neutrophils Relative %: 89 %
Platelets: 179 10*3/uL (ref 150–400)
RBC: 3.41 MIL/uL — ABNORMAL LOW (ref 4.22–5.81)
RDW: 18.7 % — ABNORMAL HIGH (ref 11.5–15.5)
WBC: 15.2 10*3/uL — ABNORMAL HIGH (ref 4.0–10.5)
nRBC: 0.3 % — ABNORMAL HIGH (ref 0.0–0.2)

## 2022-10-31 LAB — SEDIMENTATION RATE: Sed Rate: 8 mm/h (ref 0–16)

## 2022-10-31 LAB — I-STAT CG4 LACTIC ACID, ED
Lactic Acid, Venous: 3.2 mmol/L (ref 0.5–1.9)
Lactic Acid, Venous: 2.3 mmol/L (ref 0.5–1.9)

## 2022-10-31 LAB — URINALYSIS, ROUTINE W REFLEX MICROSCOPIC
Bacteria, UA: NONE SEEN
Bilirubin Urine: NEGATIVE
Glucose, UA: NEGATIVE mg/dL
Ketones, ur: NEGATIVE mg/dL
Leukocytes,Ua: NEGATIVE
Nitrite: NEGATIVE
Protein, ur: NEGATIVE mg/dL
Specific Gravity, Urine: 1.018 (ref 1.005–1.030)
pH: 6 (ref 5.0–8.0)

## 2022-10-31 LAB — PHOSPHORUS: Phosphorus: 3.6 mg/dL (ref 2.5–4.6)

## 2022-10-31 LAB — PREALBUMIN: Prealbumin: <5 mg/dL — ABNORMAL LOW (ref 18–38)

## 2022-10-31 LAB — MAGNESIUM: Magnesium: 2.4 mg/dL (ref 1.7–2.4)

## 2022-10-31 LAB — ABO/RH: ABO/RH(D): A POS

## 2022-10-31 LAB — VAS US ABI WITH/WO TBI
Left ABI: 1.83
Right ABI: 1.89

## 2022-10-31 LAB — HEMOGLOBIN A1C
Hgb A1c MFr Bld: 5.4 % (ref 4.8–5.6)
Mean Plasma Glucose: 108.28 mg/dL

## 2022-10-31 LAB — C-REACTIVE PROTEIN: CRP: 9.9 mg/dL — ABNORMAL HIGH (ref <1.0)

## 2022-10-31 MED ORDER — NICOTINE 21 MG/24HR TD PT24
21.0000 mg | MEDICATED_PATCH | TRANSDERMAL | Status: DC
  Administered 2022-10-31 – 2022-11-09 (×10): 21 mg via TRANSDERMAL
  Filled 2022-10-31 (×14): qty 1

## 2022-10-31 MED ORDER — OXYCODONE HCL 5 MG PO TABS
5.0000 mg | ORAL_TABLET | ORAL | Status: DC | PRN
  Administered 2022-10-31 – 2022-11-01 (×4): 5 mg via ORAL
  Filled 2022-10-31 (×4): qty 1

## 2022-10-31 MED ORDER — TETANUS-DIPHTH-ACELL PERTUSSIS 5-2.5-18.5 LF-MCG/0.5 IM SUSY
0.5000 mL | PREFILLED_SYRINGE | Freq: Once | INTRAMUSCULAR | Status: AC
  Administered 2022-10-31: 0.5 mL via INTRAMUSCULAR
  Filled 2022-10-31: qty 0.5

## 2022-10-31 MED ORDER — SODIUM CHLORIDE 0.9 % IV SOLN
2.0000 g | Freq: Once | INTRAVENOUS | Status: AC
  Administered 2022-10-31: 2 g via INTRAVENOUS
  Filled 2022-10-31: qty 12.5

## 2022-10-31 MED ORDER — LACTATED RINGERS IV BOLUS (SEPSIS)
1000.0000 mL | Freq: Once | INTRAVENOUS | Status: AC
  Administered 2022-10-31: 1000 mL via INTRAVENOUS

## 2022-10-31 MED ORDER — METRONIDAZOLE 500 MG/100ML IV SOLN
500.0000 mg | Freq: Two times a day (BID) | INTRAVENOUS | Status: DC
  Administered 2022-10-31: 500 mg via INTRAVENOUS
  Filled 2022-10-31: qty 100

## 2022-10-31 MED ORDER — ONDANSETRON HCL 4 MG/2ML IJ SOLN
4.0000 mg | Freq: Four times a day (QID) | INTRAMUSCULAR | Status: DC | PRN
  Administered 2022-11-14 – 2022-11-17 (×2): 4 mg via INTRAVENOUS
  Filled 2022-10-31 (×2): qty 2

## 2022-10-31 MED ORDER — KETOROLAC TROMETHAMINE 30 MG/ML IJ SOLN
30.0000 mg | Freq: Four times a day (QID) | INTRAMUSCULAR | Status: DC | PRN
  Administered 2022-10-31: 30 mg via INTRAVENOUS
  Filled 2022-10-31: qty 1

## 2022-10-31 MED ORDER — COLLAGENASE 250 UNIT/GM EX OINT
TOPICAL_OINTMENT | Freq: Every day | CUTANEOUS | Status: AC
  Filled 2022-10-31 (×3): qty 30

## 2022-10-31 MED ORDER — ONDANSETRON HCL 4 MG PO TABS: 4.0000 mg | ORAL_TABLET | Freq: Four times a day (QID) | ORAL | Status: DC | PRN

## 2022-10-31 MED ORDER — ACETAMINOPHEN 650 MG RE SUPP: 650.0000 mg | Freq: Four times a day (QID) | RECTAL | Status: DC | PRN

## 2022-10-31 MED ORDER — LACTATED RINGERS IV BOLUS (SEPSIS)
250.0000 mL | Freq: Once | INTRAVENOUS | Status: AC
  Administered 2022-10-31: 250 mL via INTRAVENOUS

## 2022-10-31 MED ORDER — VANCOMYCIN HCL IN DEXTROSE 1-5 GM/200ML-% IV SOLN
1000.0000 mg | Freq: Once | INTRAVENOUS | Status: AC
  Administered 2022-10-31: 1000 mg via INTRAVENOUS
  Filled 2022-10-31: qty 200

## 2022-10-31 MED ORDER — SODIUM CHLORIDE 0.9 % IV SOLN
2.0000 g | INTRAVENOUS | Status: DC
  Administered 2022-10-31: 2 g via INTRAVENOUS
  Filled 2022-10-31: qty 20

## 2022-10-31 MED ORDER — OXYCODONE HCL 5 MG PO TABS: 5.0000 mg | ORAL_TABLET | Freq: Four times a day (QID) | ORAL | Status: DC | PRN

## 2022-10-31 MED ORDER — HYDROMORPHONE HCL 1 MG/ML IJ SOLN
1.0000 mg | Freq: Once | INTRAMUSCULAR | Status: AC
  Administered 2022-10-31: 1 mg via INTRAVENOUS
  Filled 2022-10-31: qty 1

## 2022-10-31 MED ORDER — SODIUM CHLORIDE 0.9 % IV SOLN
3.0000 g | Freq: Four times a day (QID) | INTRAVENOUS | Status: DC
  Administered 2022-10-31 – 2022-11-01 (×3): 3 g via INTRAVENOUS
  Filled 2022-10-31 (×3): qty 8

## 2022-10-31 MED ORDER — ACETAMINOPHEN 325 MG PO TABS
650.0000 mg | ORAL_TABLET | Freq: Four times a day (QID) | ORAL | Status: DC | PRN
  Administered 2022-11-14: 650 mg via ORAL
  Filled 2022-10-31: qty 2

## 2022-10-31 MED ORDER — LACTATED RINGERS IV SOLN: INTRAVENOUS | Status: AC

## 2022-10-31 MED ORDER — KETOROLAC TROMETHAMINE 30 MG/ML IJ SOLN
30.0000 mg | Freq: Once | INTRAMUSCULAR | Status: AC
  Administered 2022-10-31: 30 mg via INTRAVENOUS
  Filled 2022-10-31: qty 1

## 2022-10-31 MED ORDER — ENOXAPARIN SODIUM 40 MG/0.4ML IJ SOSY
40.0000 mg | PREFILLED_SYRINGE | INTRAMUSCULAR | Status: DC
  Filled 2022-10-31: qty 0.4

## 2022-10-31 MED ORDER — VANCOMYCIN HCL 1250 MG/250ML IV SOLN
1250.0000 mg | Freq: Two times a day (BID) | INTRAVENOUS | Status: DC
  Administered 2022-10-31 (×2): 1250 mg via INTRAVENOUS
  Filled 2022-10-31 (×3): qty 250

## 2022-10-31 MED ORDER — SODIUM CHLORIDE 0.9% FLUSH
10.0000 mL | Freq: Two times a day (BID) | INTRAVENOUS | Status: DC
  Administered 2022-10-31 – 2022-11-01 (×3): 10 mL via INTRAVENOUS

## 2022-10-31 MED ORDER — DROPERIDOL 2.5 MG/ML IJ SOLN
1.2500 mg | Freq: Once | INTRAMUSCULAR | Status: AC
  Administered 2022-10-31: 1.25 mg via INTRAVENOUS

## 2022-10-31 NOTE — Consult Note (Addendum)
Orthopedic Consultation Note  Current Hospital Day : Hospital Day: 2  Reason For Consult: Right leg wound times greater than 2.5 years  History of Present Illness:  Rick Taylor is a 32 y.o. male who presents for worsening of a right leg wound which he has had for greater than 1 year.  He has previously been seen for this in the past including in February 2022 where there are images from this wound in the chart media section.  Presently he is having some right lower extremity pain.  He is found to have positive blood cultures.  He has been started on IV antibiotics.  History reviewed. No pertinent past medical history.  History reviewed. No pertinent surgical history.  Prior to Admission medications   Medication Sig Start Date End Date Taking? Authorizing Provider  cetirizine (ZYRTEC ALLERGY) 10 MG tablet Take 1 tablet (10 mg total) by mouth daily. Patient not taking: Reported on 10/31/2022 10/02/14   Emilia Beck, PA-C  ibuprofen (ADVIL,MOTRIN) 800 MG tablet Take 1 tablet (800 mg total) by mouth 3 (three) times daily. Patient not taking: Reported on 10/31/2022 10/02/14   Emilia Beck, PA-C  penicillin v potassium (VEETID) 500 MG tablet Take 1 tablet (500 mg total) by mouth 4 (four) times daily. Patient not taking: Reported on 10/31/2022 07/26/13   Devoria Albe, MD    Physical Examination Right lower extremity: Anterior leg wound with dry fibrinous overlying material, no significant erythema or fluid collections Grossly able to move his foot and ankle Sensation intact to light touch in the foot on the plantar aspect and dorsal aspect Foot is warm well-perfused ABIs noted to be greater than 1  Imaging: X-rays of the right tibia demonstrate no fractures, no bony erosive changes  Assessment:   Rick Taylor is a 32 y.o. male with chronic right lower extremity wound, currently admitted with bacteremia  I discussed with the patient that this is a longstanding issue he has been  dealing with for over 2 years.  Currently he is clinically stable, afebrile, normal ESR, CRP has been pending this afternoon.  Should his clinical condition deteriorate, this would necessitate an amputation.  Barring that I would recommend ongoing local wound care at the discretion of wound management and outpatient follow-up with Dr. Lajoyce Corners for discussion of any possible limb salvage options.  Plan:   Okay for weightbearing as tolerated No operative plans unless clinical condition worsens significantly and urgent BKA as needed Antibiotics per ID, positive blood cultures noted Agree with wound care recommendations for local dressings Outpatient follow-up with Dr. Lajoyce Corners for discussion of any potential limb salvage options

## 2022-10-31 NOTE — Progress Notes (Signed)
Initial Nutrition Assessment  DOCUMENTATION CODES:   Non-severe (moderate) malnutrition in context of social or environmental circumstances  INTERVENTION:  - Regular diet once medically appropriate.  - Recommend Ensure Plus High Protein po BID, each supplement provides 350 kcal and 20 grams of protein. - Wound healing diet education with handout provided.  - Recommend Multivitamin with minerals daily, 500mg  vitamin C BID, 220mg  zinc x14 days to support wound healing.  - Monitor weight trends.    NUTRITION DIAGNOSIS:   Moderate Malnutrition related to social / environmental circumstances as evidenced by severe fat depletion, moderate muscle depletion.  GOAL:   Patient will meet greater than or equal to 90% of their needs  MONITOR:   PO intake, Supplement acceptance, Diet advancement, Weight trends  REASON FOR ASSESSMENT:   Consult Wound healing  ASSESSMENT:   32 y.o. male who presented with infected wound to right lower extremity. Admitted for sepsis due to cellulitis.   Patient reports a UBW of 150-160# and weight loss over the past 2 years due to being homeless. However, patient's admission weights at 150 and 155#. No weight history PTA.   He endorses eating 2 meals a day PTA. Usually has cereal and [employer]. When asked if he ever has issues securing food each day patient reported having no issue.   Patient reports he is currently very hungry and wanting to eat. No diet order in place, patient just admitted this morning. Plan of care currently pending.   Medications reviewed and include: -  Labs reviewed:  Na 125   NUTRITION - FOCUSED PHYSICAL EXAM:  Flowsheet Row Most Recent Value  Orbital Region Mild depletion  Upper Arm Region Severe depletion  Thoracic and Lumbar Region Severe depletion  Buccal Region Moderate depletion  Temple Region Moderate depletion  Clavicle Bone Region Moderate depletion  Clavicle and Acromion Bone Region Severe depletion   Scapular Bone Region Unable to assess  Dorsal Hand Unable to assess  Patellar Region Unable to assess  Anterior Thigh Region Unable to assess  Posterior Calf Region Unable to assess  Edema (RD Assessment) None  Hair Reviewed  Eyes Reviewed  Mouth Reviewed  Skin Reviewed  Nails Reviewed       Diet Order:   Diet Order     None       EDUCATION NEEDS:  Education needs have been addressed  Skin:    No RN assessment yet completed  Last BM:  unknown  Height:  Ht Readings from Last 1 Encounters:  10/31/22 6\' 1"  (1.854 m)   Weight:  Wt Readings from Last 1 Encounters:  10/31/22 70.5 kg    BMI:  Body mass index is 20.51 kg/m.  Estimated Nutritional Needs:  Kcal:  2050-2250 kcals Protein:  100-135 grams Fluid:  >/= 2L    Shelle Iron RD, LDN For contact information, refer to [facility]

## 2022-10-31 NOTE — Consult Note (Signed)
WOC Nurse Consult Note: Reason for Consult:LE wound; history of IVDU. Wound present since 2022 Wound type: infectious/trauma Pressure Injury POA: NA Measurement: see nursing flow sheets Wound bed:100% yellow/non viable tissue. No images of posterior calf to determine if the wound is circumferential  Drainage (amount, consistency, odor) see nursing flow sheets Periwound: intact; bottle neck appearance of LE; patient may be using compression around wound for drainage control; severe epibole of wound edges  Dressing procedure/placement/frequency: Enzymatic debridement ointment daily, top with saline moist gauze, ABD pad and kerlix Cleanse wound with Vashe solution daily  Ok to use ACE wrap to hold dressing in place if needed.   Teach patient to perform wound care.    Re consult if needed, will not follow at this time. Thanks  Keishaun Hazel [employer], RN,CWOCN, CNS, CWON-AP [phone])

## 2022-10-31 NOTE — Progress Notes (Signed)
ABI's have been completed. Preliminary results can be found in CV Proc through chart review.   10/31/22 1:27 PM Olen Cordial RVT

## 2022-10-31 NOTE — ED Provider Notes (Signed)
[place] EMERGENCY DEPARTMENT AT [facility] Provider Note   CSN: [id number] Arrival date & time: 10/30/22  2335     History  Chief Complaint  Patient presents with   Leg Pain    Rick Taylor is a 32 y.o. male.  Patient presents to the emergency room complaining of infected wound to the right lower extremity.  Patient states this wound has been in existence for approximately 1 year.  He states it worsened during an incarceration.  He endorses IV drug use.  He denies nausea, vomiting, diarrhea, abdominal pain, chest pain, shortness of breath. Chart review shows wound in this same area, large, going to back to February 2022.   Leg Pain      Home Medications Prior to Admission medications   Medication Sig Start Date End Date Taking? Authorizing Provider  cetirizine (ZYRTEC ALLERGY) 10 MG tablet Take 1 tablet (10 mg total) by mouth daily. 10/02/14   Emilia Beck, PA-C  ibuprofen (ADVIL,MOTRIN) 800 MG tablet Take 1 tablet (800 mg total) by mouth 3 (three) times daily. 10/02/14   Szekalski, Yvonna Alanis, PA-C  penicillin v potassium (VEETID) 500 MG tablet Take 1 tablet (500 mg total) by mouth 4 (four) times daily. 07/26/13   Devoria Albe, MD      Allergies    Patient has no known allergies.    Review of Systems   Review of Systems  Physical Exam Updated Vital Signs BP (!) 129/56   Pulse (!) 104   Temp 98 F (36.7 C)   Resp 20   Ht 6' (1.829 m)   Wt 68 kg   SpO2 97%   BMI 20.34 kg/m  Physical Exam Vitals and nursing note reviewed.  HENT:     Head: Normocephalic and atraumatic.  Cardiovascular:     Rate and Rhythm: Regular rhythm. Tachycardia present.  Pulmonary:     Effort: Pulmonary effort is normal. No respiratory distress.     Breath sounds: Normal breath sounds.  Musculoskeletal:        General: Tenderness present.     Cervical back: Normal range of motion.  Skin:    General: Skin is dry.     Findings: Lesion present.     Comments: See attached  image.  Large ulcerated area on anterior right lower leg.  No active drainage at this time.  Neurological:     Mental Status: He is alert.  Psychiatric:        Speech: Speech normal.        Behavior: Behavior normal.     ED Results / Procedures / Treatments   Labs (all labs ordered are listed, but only abnormal results are displayed) Labs Reviewed  CBC WITH DIFFERENTIAL/PLATELET - Abnormal; Notable for the following components:      Result Value   WBC 15.2 (*)    RBC 3.41 (*)    Hemoglobin 8.2 (*)    HCT 27.0 (*)    MCV 79.2 (*)    MCH 24.0 (*)    RDW 18.7 (*)    nRBC 0.3 (*)    Neutro Abs 13.6 (*)    Abs Immature Granulocytes 0.12 (*)    All other components within normal limits  COMPREHENSIVE METABOLIC PANEL - Abnormal; Notable for the following components:   Sodium 125 (*)    Chloride 96 (*)    Glucose, Bld 110 (*)    BUN 40 (*)    Calcium 7.7 (*)    Albumin 2.3 (*)  AST 97 (*)    ALT 163 (*)    Alkaline Phosphatase 160 (*)    All other components within normal limits  I-STAT CG4 LACTIC ACID, ED - Abnormal; Notable for the following components:   Lactic Acid, Venous 3.2 (*)    All other components within normal limits  CULTURE, BLOOD (ROUTINE X 2)  CULTURE, BLOOD (ROUTINE X 2)  URINALYSIS, ROUTINE W REFLEX MICROSCOPIC    EKG None  Radiology No results found.  Procedures .Critical Care  Performed by: Darrick Grinder, PA-C Authorized by: Darrick Grinder, PA-C   Critical care provider statement:    Critical care time (minutes):  30   Critical care was necessary to treat or prevent imminent or life-threatening deterioration of the following conditions:  Sepsis   Critical care was time spent personally by me on the following activities:  Development of treatment plan with patient or surrogate, discussions with consultants, evaluation of patient's response to treatment, examination of patient, ordering and review of laboratory studies, ordering and review  of radiographic studies, ordering and performing treatments and interventions, pulse oximetry, re-evaluation of patient's condition and review of old charts     Medications Ordered in ED Medications  vancomycin (VANCOCIN) IVPB 1000 mg/200 mL premix (1,000 mg Intravenous New Bag/Given 10/31/22 0438)  lactated ringers infusion (has no administration in time range)  lactated ringers bolus 1,000 mL (1,000 mLs Intravenous New Bag/Given 10/31/22 0458)    And  lactated ringers bolus 1,000 mL (1,000 mLs Intravenous New Bag/Given 10/31/22 0458)    And  lactated ringers bolus 250 mL (has no administration in time range)  ceFEPIme (MAXIPIME) 2 g in sodium chloride 0.9 % 100 mL IVPB (0 g Intravenous Stopped 10/31/22 0520)  Tdap (BOOSTRIX) injection 0.5 mL (0.5 mLs Intramuscular Given 10/31/22 0456)  ketorolac (TORADOL) 30 MG/ML injection 30 mg (30 mg Intravenous Given 10/31/22 0452)  droperidol (INAPSINE) 2.5 MG/ML injection 1.25 mg (1.25 mg Intravenous Given 10/31/22 0457)    ED Course/ Medical Decision Making/ A&P                                 Medical Decision Making Amount and/or Complexity of Data Reviewed Labs: ordered. Radiology: ordered.  Risk Prescription drug management. Decision regarding hospitalization.   This patient presents to the ED for concern of leg wound, this involves an extensive number of treatment options, and is a complaint that carries with it a high risk of complications and morbidity.  The differential diagnosis includes cellulitis, osteomyelitis, cellulitis, others    Additional history obtained:   External records from outside source obtained and reviewed including previous emergency department notes from February of does not wish to   Lab Tests:  I Ordered, and personally interpreted labs.  The pertinent results include: Lactic acid 3.2, WBC 15.2, sodium 125, hemoglobin 8.2   Imaging Studies ordered:  I ordered imaging studies including plain  films of the right tibia-fibula I independently visualized and interpreted imaging which showed no gas, no "onion skinning". I agree with the radiologist interpretation   Cardiac Monitoring: / EKG:  The patient was maintained on a cardiac monitor.  I personally viewed and interpreted the cardiac monitored which showed an underlying rhythm of: Sinus rhythm   Consultations Obtained:  I requested consultation with the hospitalist, Dr.Segars, and discussed lab and imaging findings as well as pertinent plan - they recommend: admission   Problem List / ED  Course / Critical interventions / Medication management   I ordered medication including lactated Ringer's, cefepime, vancomycin for sepsis, Toradol and droperidol for pain control Reevaluation of the patient after these medicines showed that the patient improved I have reviewed the patients home medicines and have made adjustments as needed   Social Determinants of Health:  Patient is homeless   Test / Admission - Considered:  Patient with leukocytosis and tachycardia meeting SIRS criteria, elevated lactic acid showing signs of severe sepsis.  IV fluids and antibiotics initiated.  Code sepsis activated.  Patient will need admission for further evaluation and management.         Final Clinical Impression(s) / ED Diagnoses Final diagnoses:  Cellulitis of right lower extremity  Sepsis, due to unspecified organism, unspecified whether acute organ dysfunction present [facility])    Rx / DC Orders ED Discharge Orders     None         Pamala Duffel 10/31/22 0531    Palumbo, April, MD 10/31/22 [phone]

## 2022-10-31 NOTE — ED Notes (Signed)
ED TO INPATIENT HANDOFF REPORT  Name/Age/Gender Rick Taylor 32 y.o. male  Code Status    Code Status Orders  (From admission, onward)           Start     Ordered   10/31/22 1007  Full code  Continuous       Question:  By:  Answer:  Consent: discussion documented in EHR   10/31/22 1008           Code Status History     This patient has a current code status but no historical code status.       Home/SNF/Other Home  Chief Complaint Sepsis due to cellulitis (HCC) [L03.90, A41.9]  Level of Care/Admitting Diagnosis ED Disposition     ED Disposition  Admit   Condition  --   Comment  Hospital Area: [facility] [[id number]]  Level of Care: Telemetry [5]  Admit to tele based on following criteria: Monitor for Ischemic changes  May admit patient to [facility] or [facility] if equivalent level of care is available:: No  Covid Evaluation: Asymptomatic - no recent exposure (last 10 days) testing not required  Diagnosis: Sepsis due to cellulitis [facility]) [[id number]]  Admitting Physician: Bobette Mo [[id number]]  Attending Physician: Bobette Mo [[id number]]  Certification:: I certify this patient will need inpatient services for at least 2 midnights  Expected Medical Readiness: 11/02/2022          Medical History History reviewed. No pertinent past medical history.  Allergies No Known Allergies  IV Location/Drains/Wounds Patient Lines/Drains/Airways Status     Active Line/Drains/Airways     Name Placement date Placement time Site Days   Peripheral IV 10/31/22 20 G Anterior;Left Forearm 10/31/22  0426  Forearm  less than 1   Peripheral IV 10/31/22 18 G 1.16" Anterior;Proximal;Right Forearm 10/31/22  0448  Forearm  less than 1            Labs/Imaging Results for orders placed or performed during the hospital encounter of 10/30/22 (from the past 48 hour(s))  Urinalysis, Routine w reflex microscopic -Urine, Clean Catch      Status: Abnormal   Collection Time: 10/30/22 11:55 PM  Result Value Ref Range   Color, Urine YELLOW YELLOW   APPearance CLEAR CLEAR   Specific Gravity, Urine 1.018 1.005 - 1.030   pH 6.0 5.0 - 8.0   Glucose, UA NEGATIVE NEGATIVE mg/dL   Hgb urine dipstick MODERATE (A) NEGATIVE   Bilirubin Urine NEGATIVE NEGATIVE   Ketones, ur NEGATIVE NEGATIVE mg/dL   Protein, ur NEGATIVE NEGATIVE mg/dL   Nitrite NEGATIVE NEGATIVE   Leukocytes,Ua NEGATIVE NEGATIVE   RBC / HPF 21-50 0 - 5 RBC/hpf   WBC, UA 0-5 0 - 5 WBC/hpf   Bacteria, UA NONE SEEN NONE SEEN   Squamous Epithelial / HPF 0-5 0 - 5 /HPF    Comment: Performed at [facility], [address], [place], [place] [address]  CBC with Differential     Status: Abnormal   Collection Time: 10/31/22 12:08 AM  Result Value Ref Range   WBC 15.2 (H) 4.0 - 10.5 K/uL   RBC 3.41 (L) 4.22 - 5.81 MIL/uL   Hemoglobin 8.2 (L) 13.0 - 17.0 g/dL   HCT 30.8 (L) 65.7 - 84.6 %   MCV 79.2 (L) 80.0 - 100.0 fL   MCH 24.0 (L) 26.0 - 34.0 pg   MCHC 30.4 30.0 - 36.0 g/dL   RDW 96.2 (H) 95.2 - 84.1 %  Platelets 179 150 - 400 K/uL   nRBC 0.3 (H) 0.0 - 0.2 %   Neutrophils Relative % 89 %   Neutro Abs 13.6 (H) 1.7 - 7.7 K/uL   Lymphocytes Relative 7 %   Lymphs Abs 1.1 0.7 - 4.0 K/uL   Monocytes Relative 3 %   Monocytes Absolute 0.4 0.1 - 1.0 K/uL   Eosinophils Relative 0 %   Eosinophils Absolute 0.0 0.0 - 0.5 K/uL   Basophils Relative 0 %   Basophils Absolute 0.0 0.0 - 0.1 K/uL   Immature Granulocytes 1 %   Abs Immature Granulocytes 0.12 (H) 0.00 - 0.07 K/uL    Comment: Performed at [facility], [address] [place], [place] [address]  Comprehensive metabolic panel     Status: Abnormal   Collection Time: 10/31/22 12:08 AM  Result Value Ref Range   Sodium 125 (L) 135 - 145 mmol/L   Potassium 4.7 3.5 - 5.1 mmol/L   Chloride 96 (L) 98 - 111 mmol/L   CO2 22 22 - 32 mmol/L   Glucose, Bld 110 (H) 70 - 99 mg/dL     Comment: Glucose reference range applies only to samples taken after fasting for at least 8 hours.   BUN 40 (H) 6 - 20 mg/dL   Creatinine, Ser 2.44 0.61 - 1.24 mg/dL   Calcium 7.7 (L) 8.9 - 10.3 mg/dL   Total Protein 6.6 6.5 - 8.1 g/dL   Albumin 2.3 (L) 3.5 - 5.0 g/dL   AST 97 (H) 15 - 41 U/L   ALT 163 (H) 0 - 44 U/L   Alkaline Phosphatase 160 (H) 38 - 126 U/L   Total Bilirubin 0.8 0.3 - 1.2 mg/dL   GFR, Estimated >01 >02 mL/min    Comment: (NOTE) Calculated using the CKD-EPI Creatinine Equation (2021)    Anion gap 7 5 - 15    Comment: Performed at [facility], [address], [place], [place] [address]  I-Stat CG4 Lactic Acid     Status: Abnormal   Collection Time: 10/31/22  4:13 AM  Result Value Ref Range   Lactic Acid, Venous 3.2 (HH) 0.5 - 1.9 mmol/L   Comment NOTIFIED PHYSICIAN   I-Stat CG4 Lactic Acid     Status: Abnormal   Collection Time: 10/31/22  6:42 AM  Result Value Ref Range   Lactic Acid, Venous 2.3 (HH) 0.5 - 1.9 mmol/L   Comment NOTIFIED PHYSICIAN    DG Tibia/Fibula Right  Result Date: 10/31/2022 CLINICAL DATA:  32 year old male with gaping wound overlying the lower tibia and fibula. Evaluate for osteomyelitis. EXAM: RIGHT TIBIA AND FIBULA - 2 VIEW COMPARISON:  02/16/2020. FINDINGS: There is no evidence of fracture or other focal bone lesions. Soft tissues are unremarkable. IMPRESSION: Negative. Electronically Signed   By: Trudie Reed M.D.   On: 10/31/2022 05:42    Pending Labs Unresulted Labs (From admission, onward)     Start     Ordered   11/01/22 0500  CBC with Differential  Daily,   R      10/31/22 1004   11/01/22 0500  Comprehensive metabolic panel  Tomorrow morning,   R        10/31/22 1004   11/01/22 0500  HIV Antibody (routine testing w rflx)  (HIV Antibody (Routine testing w reflex) panel)  Tomorrow morning,   R        10/31/22 1008   10/31/22 0403  Blood culture (routine x 2)  BLOOD CULTURE  X 2,   R (with STAT occurrences)       10/31/22 0402            Vitals/Pain Today's Vitals   10/31/22 0443 10/31/22 0648 10/31/22 0800 10/31/22 1015  BP:  (!) 108/56 111/60 133/66  Pulse:  95 93 (!) 102  Resp:  16 16 16   Temp:  (!) 97.4 F (36.3 C) 98.6 F (37 C)   TempSrc:  Oral Oral   SpO2:  97% 100% 99%  Weight: 68 kg     Height: 6' (1.829 m)     PainSc:        Isolation Precautions No active isolations  Medications Medications  lactated ringers infusion ( Intravenous New Bag/Given 10/31/22 0557)  sodium chloride flush (NS) 0.9 % injection 10 mL (10 mLs Intravenous Given 10/31/22 1017)  cefTRIAXone (ROCEPHIN) 2 g in sodium chloride 0.9 % 100 mL IVPB (2 g Intravenous New Bag/Given 10/31/22 1039)  enoxaparin (LOVENOX) injection 40 mg (has no administration in time range)  acetaminophen (TYLENOL) tablet 650 mg (has no administration in time range)    Or  acetaminophen (TYLENOL) suppository 650 mg (has no administration in time range)  ondansetron (ZOFRAN) tablet 4 mg (has no administration in time range)    Or  ondansetron (ZOFRAN) injection 4 mg (has no administration in time range)  ketorolac (TORADOL) 30 MG/ML injection 30 mg (30 mg Intravenous Given 10/31/22 1040)  vancomycin (VANCOREADY) IVPB 1250 mg/250 mL (has no administration in time range)  collagenase (SANTYL) ointment (has no administration in time range)  vancomycin (VANCOCIN) IVPB 1000 mg/200 mL premix (0 mg Intravenous Stopped 10/31/22 0550)  ceFEPIme (MAXIPIME) 2 g in sodium chloride 0.9 % 100 mL IVPB (0 g Intravenous Stopped 10/31/22 0520)  Tdap (BOOSTRIX) injection 0.5 mL (0.5 mLs Intramuscular Given 10/31/22 0456)  ketorolac (TORADOL) 30 MG/ML injection 30 mg (30 mg Intravenous Given 10/31/22 0452)  droperidol (INAPSINE) 2.5 MG/ML injection 1.25 mg (1.25 mg Intravenous Given 10/31/22 0457)  lactated ringers bolus 1,000 mL (0 mLs Intravenous Stopped 10/31/22 0707)    And  lactated ringers bolus 1,000 mL (0 mLs Intravenous Stopped  10/31/22 0707)    And  lactated ringers bolus 250 mL (0 mLs Intravenous Stopped 10/31/22 0707)    Mobility walks

## 2022-10-31 NOTE — Progress Notes (Signed)
Admitting physician addendum:  The patient's blood cultures are growing Enterococcus species.  This was discussed with ID on-call (Dr. Judie Petit. Thedore Mins).  She has recommended vancomycin and Unasyn coverage.  Ceftriaxone and metronidazole have been discontinued.  Formal ID consult to follow.  Sanda Klein, MD

## 2022-10-31 NOTE — Sepsis Progress Note (Signed)
Following for sepsis monitoring ?

## 2022-10-31 NOTE — Progress Notes (Signed)
Pharmacy Antibiotic Note  Rick Taylor is a 32 y.o. male admitted on 10/30/2022 with  wound infection .  Pharmacy has been consulted for vanc dosing. Rocephin per Md  Plan: Vanc 1g x 1 already given. Start vanc 1250mg  IV q12 thereafter with goal AUC of 400-550 Rocephin 2g q24  Height: 6' (182.9 cm) Weight: 68 kg (150 lb) IBW/kg (Calculated) : 77.6  Temp (24hrs), Avg:98.5 F (36.9 C), Min:97.4 F (36.3 C), Max:99.8 F (37.7 C)  Recent Labs  Lab 10/31/22 0008 10/31/22 0413 10/31/22 0642  WBC 15.2*  --   --   CREATININE 0.93  --   --   LATICACIDVEN  --  3.2* 2.3*    Estimated Creatinine Clearance: 108.7 mL/min (by C-G formula based on SCr of 0.93 mg/dL).    No Known Allergies    Thank you for allowing pharmacy to be a part of this patient's care.  Berkley Harvey 10/31/2022 10:21 AM

## 2022-10-31 NOTE — Progress Notes (Addendum)
PHARMACY - PHYSICIAN COMMUNICATION CRITICAL VALUE ALERT - BLOOD CULTURE IDENTIFICATION (BCID)  Rick Taylor is an 32 y.o. 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  He was started on vancomycin and ceftriaxone on admission for wound infection.  Four of four blood culture bottles collected on 10/31/22 resulted back with GPC (BCID= E faecalis).   Name of physician (or Provider) Contacted: Dr. Robb Matar and Dr. Danelle Earthly  Current antibiotics: vancomycin, metronidazole and ceftriaxone  Changes to prescribed antibiotics recommended:  - per Dr. Thedore Mins, change abx to unasyn and vancomycin   Results for orders placed or performed during the hospital encounter of 10/30/22  Blood Culture ID Panel (Reflexed) (Collected: 10/31/2022  4:05 AM)  Result Value Ref Range   Enterococcus faecalis DETECTED (A) NOT DETECTED   Enterococcus Faecium NOT DETECTED NOT DETECTED   Listeria monocytogenes NOT DETECTED NOT DETECTED   Staphylococcus species NOT DETECTED NOT DETECTED   Staphylococcus aureus (BCID) NOT DETECTED NOT DETECTED   Staphylococcus epidermidis NOT DETECTED NOT DETECTED   Staphylococcus lugdunensis NOT DETECTED NOT DETECTED   Streptococcus species NOT DETECTED NOT DETECTED   Streptococcus agalactiae NOT DETECTED NOT DETECTED   Streptococcus pneumoniae NOT DETECTED NOT DETECTED   Streptococcus pyogenes NOT DETECTED NOT DETECTED   A.calcoaceticus-baumannii NOT DETECTED NOT DETECTED   Bacteroides fragilis NOT DETECTED NOT DETECTED   Enterobacterales NOT DETECTED NOT DETECTED   Enterobacter cloacae complex NOT DETECTED NOT DETECTED   Escherichia coli NOT DETECTED NOT DETECTED   Klebsiella aerogenes NOT DETECTED NOT DETECTED   Klebsiella oxytoca NOT DETECTED NOT DETECTED   Klebsiella pneumoniae NOT DETECTED NOT DETECTED   Proteus species NOT DETECTED NOT DETECTED   Salmonella species NOT DETECTED NOT DETECTED   Serratia marcescens NOT DETECTED NOT  DETECTED   Haemophilus influenzae NOT DETECTED NOT DETECTED   Neisseria meningitidis NOT DETECTED NOT DETECTED   Pseudomonas aeruginosa NOT DETECTED NOT DETECTED   Stenotrophomonas maltophilia NOT DETECTED NOT DETECTED   Candida albicans NOT DETECTED NOT DETECTED   Candida auris NOT DETECTED NOT DETECTED   Candida glabrata NOT DETECTED NOT DETECTED   Candida krusei NOT DETECTED NOT DETECTED   Candida parapsilosis NOT DETECTED NOT DETECTED   Candida tropicalis NOT DETECTED NOT DETECTED   Cryptococcus neoformans/gattii NOT DETECTED NOT DETECTED   Vancomycin resistance NOT DETECTED NOT DETECTED    Lucia Gaskins 10/31/2022  5:48 PM

## 2022-10-31 NOTE — H&P (Signed)
History and Physical    Patient: Rick Taylor QMV:[id number] DOB: 09/27/1990 DOA: 10/30/2022 DOS: the patient was seen and examined on 10/31/2022 PCP: Patient, No Pcp Per  Patient coming from: Home  Chief Complaint:  Chief Complaint  Patient presents with   Leg Pain   HPI: Rick Taylor is a 32 y.o. male with medical history significant of tobacco abuse, daily heroin use who presented to the emergency department complaints of right lower extremity leg pain where he has had a progressively worse chronic wound for over a year after he injured himself with a hammer at work.  He stated that his wound looks worse than usual.  He denied any new injuries or ever injecting heroin near the affected area.  He has had some chills and sweats, but denied fever, rhinorrhea, sore throat, wheezing or hemoptysis.  No chest pain, palpitations, diaphoresis, PND, orthopnea but has been having progressively worse pitting edema of the lower extremities recently.  No abdominal pain, nausea, emesis, diarrhea, constipation, melena or hematochezia.  No flank pain, dysuria, frequency or hematuria.  No polyuria, polydipsia, polyphagia or blurred vision.   Lab work: His CBC showed a white count of 15.2, hemoglobin 8.2 g/dL with an MCV of 28.4 fL and platelets 179.  Lactic acid is 3.2 then 2.3 mmol/L.  CMP showed a sodium 125, potassium 4.7, chloride 96 and CO2 22 mmol/L with a normal anion gap.  Glucose on 110, BUN 40 and creatinine 0.93 mg deciliter.  Calcium normalizes after correction to albumin level.  Total protein 6.6 and albumin 2.3 g/dL.  AST 97, ALT 163 and alkaline phosphatase 160 units/L.  Total bilirubin was normal.  Imaging: Right tibia and fibula x-ray was negative.  ED course: Initial vital signs were temperature 99.8 F, pulse 72, respirations 16, BP 120/89 mmHg and O2 sat 100% on room air.  The patient received cefepime 2 g IVPB, droperidol 2.5 mg IVP, ketorolac 30 mg IVP, bulks 3 0.5 mL IM x 1, vancomycin  1000 mg IVPB and 2250 mL of LR bolus followed by a continuous LR infusion at 150 mL/h for 20 hours.   Review of Systems: As mentioned in the history of present illness. All other systems reviewed and are negative. No past medical history on file. No past surgical history on file. Social History:  reports that he has been smoking. He does not have any smokeless tobacco history on file. He reports current alcohol use. He reports current drug use. Drug: IV.  No Known Allergies  No family history on file.  Prior to Admission medications   Medication Sig Start Date End Date Taking? Authorizing Provider  cetirizine (ZYRTEC ALLERGY) 10 MG tablet Take 1 tablet (10 mg total) by mouth daily. Patient not taking: Reported on 10/31/2022 10/02/14   Emilia Beck, PA-C  ibuprofen (ADVIL,MOTRIN) 800 MG tablet Take 1 tablet (800 mg total) by mouth 3 (three) times daily. Patient not taking: Reported on 10/31/2022 10/02/14   Emilia Beck, PA-C  penicillin v potassium (VEETID) 500 MG tablet Take 1 tablet (500 mg total) by mouth 4 (four) times daily. Patient not taking: Reported on 10/31/2022 07/26/13   Devoria Albe, MD    Physical Exam: Vitals:   10/31/22 0030 10/31/22 0430 10/31/22 0443 10/31/22 0648  BP: (!) 112/58 (!) 129/56  (!) 108/56  Pulse: 98 (!) 104  95  Resp: 18 20  16   Temp:  98 F (36.7 C)  (!) 97.4 F (36.3 C)  TempSrc:    Oral  SpO2: 99% 97%  97%  Weight:   68 kg   Height:   6' (1.829 m)    Physical Exam Vitals and nursing note reviewed.  Constitutional:      General: He is awake. He is not in acute distress.    Appearance: Normal appearance. He is ill-appearing.  HENT:     Head: Normocephalic.     Nose: No rhinorrhea.     Mouth/Throat:     Mouth: Mucous membranes are moist.  Eyes:     General: No scleral icterus.    Pupils: Pupils are equal, round, and reactive to light.  Neck:     Vascular: No JVD.  Cardiovascular:     Rate and Rhythm: Normal rate.     Heart  sounds: S1 normal and S2 normal.  Pulmonary:     Effort: Pulmonary effort is normal.     Breath sounds: Normal breath sounds.  Abdominal:     General: Bowel sounds are normal.     Palpations: Abdomen is soft.     Tenderness: There is no abdominal tenderness.  Musculoskeletal:        General: Signs of injury present.     Cervical back: Neck supple.     Right lower leg: 1+ Pitting Edema present.     Left lower leg: 1+ Edema present.     Comments: There is atrophy of his RLE distally.  See picture below.  Skin:    General: Skin is warm and dry.     Findings: Erythema, lesion and wound present.     Comments: RLE wound with surrounding erythema and edema.  There is some areas with necrotic tissue.  Please see picture below.  Neurological:     General: No focal deficit present.     Mental Status: He is alert.  Psychiatric:        Mood and Affect: Mood normal.        Behavior: Behavior is cooperative.     Data Reviewed:  Results are pending, will review when available.  EKG: Vent. rate 97 BPM PR interval 148 ms QRS duration 86 ms QT/QTcB 364/463 ms P-R-T axes 68 95 88 Sinus rhythm Anterior infarct, old  Assessment and Plan: Principal Problem:   Sepsis due to cellulitis (HCC) In the setting of history of:   Lower extremity injury   Cellulitis of right lower extremity Admit to telemetry/inpatient. Continue IV fluids. Continue cefepime 2 g every 8 hours.   Begin metronidazole 500 mg IVPB q 12 hr. Continue vancomycin per pharmacy. Analgesics as needed. Follow-up blood culture and sensitivity Follow CBC and CMP in a.m. Orthopedic surgery will evaluate. -Will defer imaging to their discretion.  Active Problems:   Acute opioid withdrawal (HCC)  Continue treating pain with oxycodone for the moment. Would benefit from Suboxone treatment. Consult TOC team.    Protein-calorie malnutrition, severe (HCC) In the setting of severe chronic wound.. May benefit from protein  supplementation. Nutritional services evaluation appreciated. Follow-up albumin level.    Hyponatremia Denied beer consumption. Continue IV fluids. Free water fluid restriction    Abnormal LFTs Check acute hepatitis panel. Check HIV test.    Microcytic anemia In the setting of chronic disease. Monitor hematocrit and hemoglobin. Transfuse as needed.    Hyperglycemia Check fasting glucose in AM. Check hemoglobin A1c if elevated.    Tobacco abuse Tobacco cessation advised. Nicotine replacement therapy ordered.    Advance Care Planning:   Code Status: Full Code   Consults: Orthopedic surgery (  Lindajo Royal, MD).  Family Communication:   Severity of Illness: The appropriate patient status for this patient is INPATIENT. Inpatient status is judged to be reasonable and necessary in order to provide the required intensity of service to ensure the patient's safety. The patient's presenting symptoms, physical exam findings, and initial radiographic and laboratory data in the context of their chronic comorbidities is felt to place them at high risk for further clinical deterioration. Furthermore, it is not anticipated that the patient will be medically stable for discharge from the hospital within 2 midnights of admission.   * I certify that at the point of admission it is my clinical judgment that the patient will require inpatient hospital care spanning beyond 2 midnights from the point of admission due to high intensity of service, high risk for further deterioration and high frequency of surveillance required.*  Author: Bobette Mo, MD 10/31/2022 7:17 AM  For on call review [email or web address].   This document was prepared using Dragon voice recognition software and may contain some unintended transcription errors.

## 2022-11-01 ENCOUNTER — Inpatient Hospital Stay (HOSPITAL_COMMUNITY): Payer: Self-pay

## 2022-11-01 ENCOUNTER — Other Ambulatory Visit: Payer: Self-pay | Admitting: Physician Assistant

## 2022-11-01 DIAGNOSIS — I509 Heart failure, unspecified: Secondary | ICD-10-CM

## 2022-11-01 LAB — CBC WITH DIFFERENTIAL/PLATELET
Abs Immature Granulocytes: 0.18 10*3/uL — ABNORMAL HIGH (ref 0.00–0.07)
Abs Immature Granulocytes: 0.16 10*3/uL — ABNORMAL HIGH (ref 0.00–0.07)
Basophils Absolute: 0 10*3/uL (ref 0.0–0.1)
Basophils Absolute: 0 10*3/uL (ref 0.0–0.1)
Basophils Relative: 0 %
Basophils Relative: 0 %
Eosinophils Absolute: 0 10*3/uL (ref 0.0–0.5)
Eosinophils Absolute: 0 10*3/uL (ref 0.0–0.5)
Eosinophils Relative: 0 %
Eosinophils Relative: 0 %
HCT: 22.5 % — ABNORMAL LOW (ref 39.0–52.0)
HCT: 28.8 % — ABNORMAL LOW (ref 39.0–52.0)
Hemoglobin: 6.8 g/dL — CL (ref 13.0–17.0)
Hemoglobin: 8.8 g/dL — ABNORMAL LOW (ref 13.0–17.0)
Immature Granulocytes: 1 %
Immature Granulocytes: 1 %
Lymphocytes Relative: 8 %
Lymphocytes Relative: 11 %
Lymphs Abs: 1.4 10*3/uL (ref 0.7–4.0)
Lymphs Abs: 2.1 10*3/uL (ref 0.7–4.0)
MCH: 24 pg — ABNORMAL LOW (ref 26.0–34.0)
MCH: 24 pg — ABNORMAL LOW (ref 26.0–34.0)
MCHC: 30.2 g/dL (ref 30.0–36.0)
MCHC: 30.6 g/dL (ref 30.0–36.0)
MCV: 79.5 fL — ABNORMAL LOW (ref 80.0–100.0)
MCV: 78.7 fL — ABNORMAL LOW (ref 80.0–100.0)
Monocytes Absolute: 0.6 10*3/uL (ref 0.1–1.0)
Monocytes Absolute: 0.7 10*3/uL (ref 0.1–1.0)
Monocytes Relative: 3 %
Monocytes Relative: 4 %
Neutro Abs: 15.4 10*3/uL — ABNORMAL HIGH (ref 1.7–7.7)
Neutro Abs: 16 10*3/uL — ABNORMAL HIGH (ref 1.7–7.7)
Neutrophils Relative %: 88 %
Neutrophils Relative %: 84 %
Platelets: 165 10*3/uL (ref 150–400)
Platelets: 200 10*3/uL (ref 150–400)
RBC: 2.83 MIL/uL — ABNORMAL LOW (ref 4.22–5.81)
RBC: 3.66 MIL/uL — ABNORMAL LOW (ref 4.22–5.81)
RDW: 19.9 % — ABNORMAL HIGH (ref 11.5–15.5)
RDW: 19.5 % — ABNORMAL HIGH (ref 11.5–15.5)
WBC: 17.6 10*3/uL — ABNORMAL HIGH (ref 4.0–10.5)
WBC: 19 10*3/uL — ABNORMAL HIGH (ref 4.0–10.5)
nRBC: 0.2 % (ref 0.0–0.2)
nRBC: 0.2 % (ref 0.0–0.2)

## 2022-11-01 LAB — COMPREHENSIVE METABOLIC PANEL
ALT: 102 U/L — ABNORMAL HIGH (ref 0–44)
AST: 48 U/L — ABNORMAL HIGH (ref 15–41)
Albumin: 2 g/dL — ABNORMAL LOW (ref 3.5–5.0)
Alkaline Phosphatase: 122 U/L (ref 38–126)
Anion gap: 7 (ref 5–15)
BUN: 25 mg/dL — ABNORMAL HIGH (ref 6–20)
CO2: 19 mmol/L — ABNORMAL LOW (ref 22–32)
Calcium: 7.4 mg/dL — ABNORMAL LOW (ref 8.9–10.3)
Chloride: 103 mmol/L (ref 98–111)
Creatinine, Ser: 0.5 mg/dL — ABNORMAL LOW (ref 0.61–1.24)
GFR, Estimated: >60 mL/min (ref >60)
Glucose, Bld: 118 mg/dL — ABNORMAL HIGH (ref 70–99)
Potassium: 4 mmol/L (ref 3.5–5.1)
Sodium: 129 mmol/L — ABNORMAL LOW (ref 135–145)
Total Bilirubin: 0.8 mg/dL (ref 0.3–1.2)
Total Protein: 5.8 g/dL — ABNORMAL LOW (ref 6.5–8.1)

## 2022-11-01 LAB — FIBRINOGEN: Fibrinogen: 292 mg/dL (ref 210–475)

## 2022-11-01 LAB — PROTIME-INR
INR: 1.5 — ABNORMAL HIGH (ref 0.8–1.2)
Prothrombin Time: 17.9 s — ABNORMAL HIGH (ref 11.4–15.2)

## 2022-11-01 LAB — TECHNOLOGIST SMEAR REVIEW

## 2022-11-01 LAB — MRSA NEXT GEN BY PCR, NASAL: MRSA by PCR Next Gen: DETECTED — AB

## 2022-11-01 LAB — RETICULOCYTES
Immature Retic Fract: 26.1 % — ABNORMAL HIGH (ref 2.3–15.9)
RBC.: 3.71 MIL/uL — ABNORMAL LOW (ref 4.22–5.81)
Retic Count, Absolute: 195.5 10*3/uL — ABNORMAL HIGH (ref 19.0–186.0)
Retic Ct Pct: 5.3 % — ABNORMAL HIGH (ref 0.4–3.1)

## 2022-11-01 LAB — RAPID URINE DRUG SCREEN, HOSP PERFORMED
Amphetamines: POSITIVE — AB
Barbiturates: NOT DETECTED
Benzodiazepines: NOT DETECTED
Cocaine: POSITIVE — AB
Opiates: POSITIVE — AB
Tetrahydrocannabinol: POSITIVE — AB

## 2022-11-01 LAB — HIV ANTIBODY (ROUTINE TESTING W REFLEX): HIV Screen 4th Generation wRfx: NONREACTIVE

## 2022-11-01 LAB — PREPARE RBC (CROSSMATCH)

## 2022-11-01 MED ORDER — VITAMIN C 500 MG PO TABS
500.0000 mg | ORAL_TABLET | Freq: Two times a day (BID) | ORAL | Status: DC
  Administered 2022-11-01 – 2022-11-17 (×34): 500 mg via ORAL
  Filled 2022-11-01 (×34): qty 1

## 2022-11-01 MED ORDER — IPRATROPIUM-ALBUTEROL 0.5-2.5 (3) MG/3ML IN SOLN
3.0000 mL | Freq: Four times a day (QID) | RESPIRATORY_TRACT | Status: DC
  Administered 2022-11-02: 3 mL via RESPIRATORY_TRACT
  Filled 2022-11-01: qty 3

## 2022-11-01 MED ORDER — CLONIDINE HCL 0.1 MG PO TABS: 0.1000 mg | ORAL_TABLET | Freq: Every day | ORAL | Status: DC

## 2022-11-01 MED ORDER — FERROUS SULFATE 325 (65 FE) MG PO TABS
325.0000 mg | ORAL_TABLET | Freq: Every day | ORAL | Status: DC
  Administered 2022-11-01 – 2022-11-17 (×17): 325 mg via ORAL
  Filled 2022-11-01 (×17): qty 1

## 2022-11-01 MED ORDER — BUPRENORPHINE HCL-NALOXONE HCL 8-2 MG SL SUBL
1.0000 | SUBLINGUAL_TABLET | Freq: Two times a day (BID) | SUBLINGUAL | Status: DC
  Administered 2022-11-02 – 2022-11-17 (×32): 1 via SUBLINGUAL
  Filled 2022-11-01 (×32): qty 1

## 2022-11-01 MED ORDER — BUSPIRONE HCL 10 MG PO TABS
10.0000 mg | ORAL_TABLET | Freq: Two times a day (BID) | ORAL | Status: DC
  Administered 2022-11-01 – 2022-11-17 (×34): 10 mg via ORAL
  Filled 2022-11-01 (×34): qty 1

## 2022-11-01 MED ORDER — METHOCARBAMOL 500 MG PO TABS
500.0000 mg | ORAL_TABLET | Freq: Three times a day (TID) | ORAL | Status: DC
  Administered 2022-11-01 – 2022-11-17 (×49): 500 mg via ORAL
  Filled 2022-11-01 (×51): qty 1

## 2022-11-01 MED ORDER — LACTATED RINGERS IV SOLN: INTRAVENOUS | Status: DC

## 2022-11-01 MED ORDER — OXYCODONE HCL 5 MG PO TABS
5.0000 mg | ORAL_TABLET | ORAL | Status: DC | PRN
  Administered 2022-11-01 – 2022-11-16 (×5): 5 mg via ORAL
  Filled 2022-11-01 (×6): qty 1

## 2022-11-01 MED ORDER — GADOBUTROL 1 MMOL/ML IV SOLN
7.0000 mL | Freq: Once | INTRAVENOUS | Status: AC | PRN
  Administered 2022-11-01: 7 mL via INTRAVENOUS

## 2022-11-01 MED ORDER — CLONIDINE HCL 0.1 MG PO TABS: 0.1000 mg | ORAL_TABLET | ORAL | Status: DC

## 2022-11-01 MED ORDER — METHOCARBAMOL 500 MG PO TABS: 500.0000 mg | ORAL_TABLET | Freq: Three times a day (TID) | ORAL | Status: DC | PRN

## 2022-11-01 MED ORDER — MUPIROCIN 2 % EX OINT
1.0000 | TOPICAL_OINTMENT | Freq: Two times a day (BID) | CUTANEOUS | Status: DC
  Administered 2022-11-01 – 2022-11-06 (×9): 1 via NASAL
  Filled 2022-11-01 (×2): qty 22

## 2022-11-01 MED ORDER — CLONIDINE HCL 0.1 MG PO TABS
0.2000 mg | ORAL_TABLET | Freq: Once | ORAL | Status: AC
  Administered 2022-11-01: 0.2 mg via ORAL
  Filled 2022-11-01: qty 2

## 2022-11-01 MED ORDER — DICYCLOMINE HCL 20 MG PO TABS
20.0000 mg | ORAL_TABLET | Freq: Four times a day (QID) | ORAL | Status: AC | PRN
  Administered 2022-11-01: 20 mg via ORAL
  Filled 2022-11-01: qty 1

## 2022-11-01 MED ORDER — ZINC SULFATE 220 (50 ZN) MG PO CAPS
220.0000 mg | ORAL_CAPSULE | Freq: Every day | ORAL | Status: AC
  Administered 2022-11-01 – 2022-11-14 (×14): 220 mg via ORAL
  Filled 2022-11-01 (×14): qty 1

## 2022-11-01 MED ORDER — B COMPLEX-C PO TABS
1.0000 | ORAL_TABLET | Freq: Every day | ORAL | Status: DC
  Filled 2022-11-01: qty 1

## 2022-11-01 MED ORDER — SODIUM CHLORIDE 0.9 % IV SOLN
2.0000 g | INTRAVENOUS | Status: DC
  Administered 2022-11-01 – 2022-11-11 (×58): 2 g via INTRAVENOUS
  Filled 2022-11-01 (×61): qty 2000

## 2022-11-01 MED ORDER — SODIUM CHLORIDE 0.9 % IV SOLN
2.0000 g | Freq: Four times a day (QID) | INTRAVENOUS | Status: DC
  Filled 2022-11-01: qty 2000

## 2022-11-01 MED ORDER — PROCHLORPERAZINE EDISYLATE 10 MG/2ML IJ SOLN
10.0000 mg | Freq: Once | INTRAMUSCULAR | Status: AC
  Administered 2022-11-01: 10 mg via INTRAVENOUS
  Filled 2022-11-01: qty 2

## 2022-11-01 MED ORDER — CLONIDINE HCL 0.1 MG PO TABS
0.1000 mg | ORAL_TABLET | Freq: Three times a day (TID) | ORAL | Status: DC
  Administered 2022-11-01 (×3): 0.1 mg via ORAL
  Filled 2022-11-01 (×3): qty 1

## 2022-11-01 MED ORDER — BUPRENORPHINE HCL-NALOXONE HCL 2-0.5 MG SL SUBL
1.0000 | SUBLINGUAL_TABLET | SUBLINGUAL | Status: DC | PRN
  Administered 2022-11-01 – 2022-11-02 (×3): 1 via SUBLINGUAL
  Filled 2022-11-01 (×3): qty 1

## 2022-11-01 MED ORDER — CHLORHEXIDINE GLUCONATE CLOTH 2 % EX PADS
6.0000 | MEDICATED_PAD | Freq: Every day | CUTANEOUS | Status: DC
  Administered 2022-11-05 – 2022-11-06 (×2): 6 via TOPICAL

## 2022-11-01 MED ORDER — LOPERAMIDE HCL 2 MG PO CAPS: 2.0000 mg | ORAL_CAPSULE | ORAL | Status: DC | PRN

## 2022-11-01 MED ORDER — HYDROXYZINE HCL 25 MG PO TABS
25.0000 mg | ORAL_TABLET | Freq: Four times a day (QID) | ORAL | Status: DC | PRN
Start: 2022-11-01 — End: 2022-11-02
  Administered 2022-11-01 – 2022-11-02 (×3): 25 mg via ORAL
  Filled 2022-11-01 (×3): qty 1

## 2022-11-01 MED ORDER — OXYCODONE HCL 5 MG PO TABS
10.0000 mg | ORAL_TABLET | ORAL | Status: DC | PRN
  Administered 2022-11-02 – 2022-11-17 (×29): 10 mg via ORAL
  Filled 2022-11-01 (×30): qty 2

## 2022-11-01 MED ORDER — ADULT MULTIVITAMIN W/MINERALS CH
1.0000 | ORAL_TABLET | Freq: Every day | ORAL | Status: DC
  Administered 2022-11-01 – 2022-11-17 (×17): 1 via ORAL
  Filled 2022-11-01 (×17): qty 1

## 2022-11-01 MED ORDER — HYDROXYZINE HCL 25 MG PO TABS
25.0000 mg | ORAL_TABLET | Freq: Once | ORAL | Status: AC
  Administered 2022-11-01: 25 mg via ORAL
  Filled 2022-11-01: qty 1

## 2022-11-01 MED ORDER — ENSURE ENLIVE PO LIQD
237.0000 mL | Freq: Three times a day (TID) | ORAL | Status: DC
  Administered 2022-11-01 – 2022-11-03 (×5): 237 mL via ORAL

## 2022-11-01 MED ORDER — PIPERACILLIN-TAZOBACTAM 3.375 G IVPB: 3.3750 g | Freq: Three times a day (TID) | INTRAVENOUS | Status: DC

## 2022-11-01 NOTE — H&P (View-Only) (Signed)
 [facility] Progress Note Patient: Rick Taylor WUJ:[id number] DOB: Mar 25, 1990 DOA: 10/30/2022  DOS: the patient was seen and examined on 11/01/2022  Brief hospital course: PMH of polysubstance abuse presented to the hospital with complaints of right leg pain. Currently being treated for Enterococcus bacteremia as well as right leg cellulitis.  Assessment and Plan: Right leg cellulitis. E faecalis bacteremia. Concern for infective endocarditis. Cardiac murmur. Presents with complaints of right leg pain. Appears to have some evidence of infection. X-ray negative for any osteomyelitis but MRI ordered. Vascular evaluation shows adequate circulation. Orthopedic recommended local wound care for now. Blood culture came back positive for E faecalis. With history of IV drug abuse concern for infective endocarditis. Especially in the setting of cardiac murmur. Appreciate ID consultation. Echocardiogram TTE and TEE requested. Will monitor. Repeat cultures ordered. Had Pseudomonas in the past and the local infection workup although agree with ID to narrow the antibiotic to ampicillin for now.  Polysubstance abuse. 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 He is UDS is positive for amphetamine, opiates, cocaine, cannabis.  (UDS performed after receiving opiates in the hospital) Reports 3-4 times fentanyl use at home. At present patient is critically ill.  Most likely will require prolonged IV antibiotic in the hospital.  May require surgical debridement of for his wound as well. Has a heavy use of opioid use and currently appears to be undergoing withdrawal Patient is willing to stop abusing substances. Will offer Suboxone treatment in the hospital and monitor. Continue clonidine for withdrawal. BuSpar for anxiety.  Attempt for substance use in the hospital. Patient was caught attempting to inject substance in the hospital on 10/31. Security was called in, multiple syringes and  needles were identified in his personal belongings as well as different substance. Visitation restricted. Telemonitoring initiated.  Hyponatremia. Metabolic acidosis. Etiology not clear for now we will monitor.  Elevated LFT. AST less than ALT. Bilirubin normal. Again etiology not clear. Will monitor. Further workup pending trend.  Anemia. Most likely anemia of chronic disease. Hemoglobin dropped down to 6.9. Requiring blood transfusion. Fibrinogen normal.  INR 1.5.  Reticulocyte count elevated. Transfuse for hemoglobin less than 7.  Thrombocytopenia. Improving. For now monitor.   Moderate protein calorie malnutrition  Nutrition Problem: Moderate Malnutrition Etiology: social / environmental circumstances Nutrition Interventions: Interventions: Refer to RD note for recommendations Body mass index is 20.51 kg/m.    Subjective: No nausea no vomiting no fever no chills.  Was caught attempting to inject substance.  Reports shortness of breath.  Reports leg pain.  Reports difficulty sleeping.  Physical Exam: General: in severe distress, No Rash Cardiovascular: S1 and S2 Present, No Murmur Respiratory: Increased respiratory effort, Bilateral Air entry present. No Crackles, No wheezes Abdomen: Bowel Sound present, No tenderness Extremities: Bilateral lower extremity edema, right lower extremity wound seen. Neuro: Alert and oriented x3, no new focal deficit   Data Reviewed: I have Reviewed nursing notes, Vitals, and Lab results. Since last encounter, pertinent lab results CBC and CMP   . I have ordered test including CBC and CMP anemia panel  . I have discussed pt's care plan and test results with ID and cardiology  . I have ordered imaging echocardiogram  .   Disposition: Status is: Inpatient Remains inpatient appropriate because: Required IV antibiotic.   Family Communication: No one at bedside Level of care: Telemetry for now monitor on telemetry. Vitals:    11/01/22 0825 11/01/22 0902 11/01/22 1012 11/01/22 1220  BP: 118/66 119/63 121/61 109/79  Pulse:  100  (!) 104 (!) 110  Resp: 16  16 (!) 22  Temp: 98.6 F (37 C)  98.3 F (36.8 C) 98.2 F (36.8 C)  TempSrc: Oral  Oral Oral  SpO2: 98%   100%  Weight:      Height:       The patient is critically ill with multiple organ systems failure and requires high complexity decision making for assessment and support, frequent evaluation and titration of therapies. Critical Care Time devoted to patient care services described in this note is 35 minutes   Author: Lynden Oxford, MD 11/01/2022 6:10 PM  Please look on [email or web address] to find out who is on call.

## 2022-11-01 NOTE — Progress Notes (Signed)
Orthopaedic Surgery Progress Note  TTE completed with endocarditis and aortic valve regurgitation. Transfer to [facility] pending. Receiving wound care for leg. MRI tibia completed  Exam: Right lower extremity: Anterior leg wound with dry fibrinous overlying material, no significant erythema or fluid collections Grossly able to move his foot and ankle Sensation intact to light touch in the foot on the plantar aspect and dorsal aspect Foot is warm well-perfused  Assessment: 32 y.o. male with 2.5 year chronic RLE leg wound, IVDU, endocarditis.  My interpretation of his tibia MRI is no bone involvement/osteomyelitis.  Recommend ongoing abx, wound care/dressing changes  Plan: Okay for weightbearing as tolerated No operative plans unless clinical condition worsens significantly and BKA is needed Antibiotics per ID, positive blood cultures noted, endocarditis Agree with wound care recommendations for local dressings Outpatient follow-up with Dr. Lajoyce Corners for discussion of any potential limb salvage options when clinically stable

## 2022-11-01 NOTE — Consult Note (Signed)
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 Ortho evaluated --> local wound care. Will get mri too as if suggestion of OM would like to diagnose/manage earlier to prevent nidus and recurrent infection  High burden e faecalis community acquired bacteremia. Ivdu. High risk endocarditis. Would need ie r/o. Initial source ?wound vs ivdu   Plan: Tte and TEE Mri right tib-fib Repeat bcx Will narrow abx to ampicillin; doesn't appear the wound is infected and wound care alone should be sufficient for that Discussed with primary team      ------------------------------------------------ Principal Problem:   Sepsis due to cellulitis [facility]) Active Problems:   Protein-calorie malnutrition, severe (HCC)   Hyponatremia   Abnormal LFTs   Microcytic anemia   Hyperglycemia   Tobacco abuse   Malnutrition of moderate degree   Lower extremity injury   Cellulitis of right lower extremity   Acute opioid withdrawal (HCC)    HPI: Rick Taylor is a 31 y.o. male  iv heroin abuse, chronic right tib-fib open wound, admitted 10/29 with rle wound pain, chill found to have e faecalis bacteremia  He has had the wound for at least a year. Thinks it's looking worse and has pain as well He uses iv heroin every day and has had withdrawal  Endorses a few days fever/chill  No focal pain otherwise  Inject in bilateral upper ext; no skin popping. No LE injection  The wound was due to work related accident during which he hit his leg with a  hammer  Leukocytosis at presentation; no fever Xray of the tib-fib no osseous abnormality bsAbx started Bcx returned with 2 of 2 set e faecalis  No other complaint    History reviewed. No pertinent family history.  Social History   Tobacco Use   Smoking status: Every Day  Substance Use Topics   Alcohol use: Yes    Comment: occ   Drug use: Yes    Types: IV    No Known Allergies  Review of Systems: ROS All Other ROS was negative, except mentioned above   History reviewed. No pertinent past medical history.     Scheduled Meds:  ascorbic acid  500 mg Oral BID   cloNIDine  0.1 mg Oral TID   Followed by   Melene Muller ON 11/03/2022] cloNIDine  0.1 mg Oral BH-qamhs   Followed by   Melene Muller ON 11/06/2022] cloNIDine  0.1 mg Oral QAC breakfast   collagenase   Topical Daily   feeding supplement  237 mL Oral TID BM   ferrous sulfate  325 mg Oral Q breakfast   methocarbamol  500 mg Oral TID   multivitamin with minerals  1 tablet Oral Daily   nicotine  21 mg Transdermal Q24H   sodium chloride flush  10 mL Intravenous Q12H   zinc sulfate  220 mg Oral Daily   Continuous Infusions:  piperacillin-tazobactam (ZOSYN)  IV     vancomycin 1,250 mg (10/31/22 2343)   PRN Meds:.acetaminophen **  OR** acetaminophen, dicyclomine, hydrOXYzine, loperamide, ondansetron **OR** ondansetron (ZOFRAN) IV, oxyCODONE **OR** oxyCODONE   OBJECTIVE: Blood pressure 119/63, pulse 100, temperature 98.6 F (37 C), temperature source Oral, resp. rate 16, height 6\' 1"  (1.854 m), weight 70.5 kg, SpO2 98%.  Physical Exam  General/constitutional: no distress, chronically ill appearing, thin HEENT: Normocephalic, PER, Conj Clear, EOMI, Oropharynx clear Neck supple CV: rrr no mrg Lungs: clear to auscultation, normal respiratory effort Abd: Soft, Nontender Ext: bilateral pedal edema Skin: dressing rle intact with some serosanguinous staining. The wound showed eschar but no surrounding cellulitic changes  otherwise Neuro: nonfocal MSK: no peripheral joint swelling/tenderness/warmth; back spines nontender   Lab Results Lab Results  Component Value Date   WBC 17.6 (H) 11/01/2022   HGB 6.8 (LL) 11/01/2022   HCT 22.5 (L) 11/01/2022   MCV 79.5 (L) 11/01/2022   PLT 165 11/01/2022    Lab Results  Component Value Date   CREATININE 0.50 (L) 11/01/2022   BUN 25 (H) 11/01/2022   NA 129 (L) 11/01/2022   K 4.0 11/01/2022   CL 103 11/01/2022   CO2 19 (L) 11/01/2022    Lab Results  Component Value Date   ALT 102 (H) 11/01/2022   AST 48 (H) 11/01/2022   ALKPHOS 122 11/01/2022   BILITOT 0.8 11/01/2022      Microbiology: Recent Results (from the past 240 hour(s))  Blood culture (routine x 2)     Status: None (Preliminary result)   Collection Time: 10/31/22  4:05 AM   Specimen: BLOOD RIGHT FOREARM  Result Value Ref Range Status   Specimen Description   Final    BLOOD RIGHT FOREARM Performed at [facility], [address] [place], [place] [address]    Special Requests   Final    BOTTLES DRAWN AEROBIC AND ANAEROBIC Blood Culture adequate volume Performed at [facility], [address], [place], [place] [address]    Culture  Setup Time   Final    GRAM POSITIVE COCCI IN PAIRS IN BOTH AEROBIC AND ANAEROBIC BOTTLES CRITICAL RESULT CALLED TO, READ BACK BY AND VERIFIED WITH: PHARMD C. AHMEND [id number] @ 1749 FH Performed at [facility], [address], [place], [place] [address]    Culture GRAM POSITIVE COCCI IN PAIRS  Final   Report Status PENDING  Incomplete  Blood culture (routine x 2)     Status: None (Preliminary result)   Collection Time: 10/31/22  4:05 AM   Specimen: BLOOD LEFT FOREARM  Result Value Ref Range Status   Specimen Description   Final    BLOOD LEFT FOREARM Performed at [facility], [address], [place], [place] [address]    Special Requests   Final    BOTTLES DRAWN AEROBIC AND ANAEROBIC Blood Culture adequate  volume Performed at [facility], [address], [place], [place] [address]    Culture  Setup Time   Final    GRAM POSITIVE COCCI IN BOTH AEROBIC AND ANAEROBIC BOTTLES PHARMD A. [facility] [id number] @ 1745 FH Performed at [facility], [address], [place], [place] [address]    Culture GRAM POSITIVE COCCI  Final   Report Status PENDING  Incomplete  Blood Culture ID Panel (Reflexed)     Status: Abnormal   Collection Time: 10/31/22  4:05 AM  Result Value Ref Range Status   Enterococcus faecalis DETECTED (A) NOT DETECTED Final    Comment: CRITICAL RESULT CALLED TO, READ BACK BY AND  VERIFIED WITH: PHARMD A. PHAM [id number] @ 1745 FH    Enterococcus Faecium NOT DETECTED NOT DETECTED Final   Listeria monocytogenes NOT DETECTED NOT DETECTED Final   Staphylococcus species NOT DETECTED NOT DETECTED Final   Staphylococcus aureus (BCID) NOT DETECTED NOT DETECTED Final   Staphylococcus epidermidis NOT DETECTED NOT DETECTED Final   Staphylococcus lugdunensis NOT DETECTED NOT DETECTED Final   Streptococcus species NOT DETECTED NOT DETECTED Final   Streptococcus agalactiae NOT DETECTED NOT DETECTED Final   Streptococcus pneumoniae NOT DETECTED NOT DETECTED Final   Streptococcus pyogenes NOT DETECTED NOT DETECTED Final   A.calcoaceticus-baumannii NOT DETECTED NOT DETECTED Final   Bacteroides fragilis NOT DETECTED NOT DETECTED Final   Enterobacterales NOT DETECTED NOT DETECTED Final   Enterobacter cloacae complex NOT DETECTED NOT DETECTED Final   Escherichia coli NOT DETECTED NOT DETECTED Final   Klebsiella aerogenes NOT DETECTED NOT DETECTED Final   Klebsiella oxytoca NOT DETECTED NOT DETECTED Final   Klebsiella pneumoniae NOT DETECTED NOT DETECTED Final   Proteus species NOT DETECTED NOT DETECTED Final   Salmonella species NOT DETECTED NOT DETECTED Final   Serratia marcescens NOT DETECTED NOT DETECTED Final   Haemophilus influenzae NOT DETECTED NOT DETECTED Final   Neisseria  meningitidis NOT DETECTED NOT DETECTED Final   Pseudomonas aeruginosa NOT DETECTED NOT DETECTED Final   Stenotrophomonas maltophilia NOT DETECTED NOT DETECTED Final   Candida albicans NOT DETECTED NOT DETECTED Final   Candida auris NOT DETECTED NOT DETECTED Final   Candida glabrata NOT DETECTED NOT DETECTED Final   Candida krusei NOT DETECTED NOT DETECTED Final   Candida parapsilosis NOT DETECTED NOT DETECTED Final   Candida tropicalis NOT DETECTED NOT DETECTED Final   Cryptococcus neoformans/gattii NOT DETECTED NOT DETECTED Final   Vancomycin resistance NOT DETECTED NOT DETECTED Final    Comment: Performed at [facility], [address], [place], [place] [address]     Serology:    Imaging: If present, new imagings (plain films, ct scans, and mri) have been personally visualized and interpreted; radiology reports have been reviewed. Decision making incorporated into the Impression / Recommendations.  10/30 right tibia xray FINDINGS: There is no evidence of fracture or other focal bone lesions. Soft tissues are unremarkable.   IMPRESSION: Negative.  Raymondo Band, MD [facility] [phone] pager    11/01/2022, 9:20 AM

## 2022-11-01 NOTE — Progress Notes (Addendum)
Pharmacy Antibiotic Note  Rick Taylor is a 32 y.o. male admitted on 10/30/2022 with infected right LE wound.  Pharmacy has been consulted for Zosyn dosing.  Patient is afebrile, WBC 17.6 (uptrending), and patient's renal function okay (CrCl 131 mL/min, Scr <1).   Plan: Zosyn 3.375g IV q8h (4 hour infusion). Monitor renal function and for signs of clinical improvement  Height: 6\' 1"  (185.4 cm) Weight: 70.5 kg (155 lb 6.8 oz) IBW/kg (Calculated) : 79.9  Temp (24hrs), Avg:98.1 F (36.7 C), Min:97.4 F (36.3 C), Max:98.9 F (37.2 C)  Recent Labs  Lab 10/31/22 0008 10/31/22 0413 10/31/22 0642 10/31/22 1355 11/01/22 0507  WBC 15.2*  --   --  15.8* 17.6*  CREATININE 0.93  --   --  0.69 0.50*  LATICACIDVEN  --  3.2* 2.3*  --   --     Estimated Creatinine Clearance: 131 mL/min (A) (by C-G formula based on SCr of 0.5 mg/dL (L)).    No Known Allergies  Antimicrobials this admission: 10/30 vanc 1g, cefepime 2g x 1 10/30 metronidazole x1 10/30 Vanc >> 10/30 Rocephin x1 10/30 Unasyn>> 10/31 10/31 Zosyn >>  Microbiology results: 10/30 BCx x2: 4/4 GPC (BCID= enterococcus faecalis)  Thank you for allowing pharmacy to be a part of this patient's care.  Roslyn Smiling, PharmD PGY1 Pharmacy Resident 11/01/2022 10:20 AM  ------------------------------------------------------------------------------------------------------ [name] 11/01/2022 at 1011  Discontinuing vancomycin and Zosyn and switching to ampicillin 2g q4h per Dr. Franco Collet, PharmD PGY1 Pharmacy Resident 11/01/2022 10:20 AM

## 2022-11-01 NOTE — Progress Notes (Signed)
   11/01/22 1505  Patient Belongings  Patient/Family advised about valuables policy? Yes  Home Medications No meds brought to hospital  Patient Belongings Sent to security     Multiple VAPE, marijuana and other contraband.

## 2022-11-01 NOTE — Progress Notes (Addendum)
Dr. Allena Katz at bedside. Security at bedside searching his belongings.

## 2022-11-01 NOTE — Progress Notes (Signed)
Attempted Echocardiogram, Patient went to MRI.

## 2022-11-01 NOTE — Progress Notes (Signed)
Patient enroute to MRI

## 2022-11-01 NOTE — Plan of Care (Signed)
  Problem: Education: Goal: Knowledge of General Education information will improve Description: Including pain rating scale, medication(s)/side effects and non-pharmacologic comfort measures Outcome: Progressing   Problem: Clinical Measurements: Goal: Ability to maintain clinical measurements within normal limits will improve Outcome: Progressing   Problem: Nutrition: Goal: Adequate nutrition will be maintained Outcome: Progressing   Problem: Pain Management: Goal: General experience of comfort will improve Outcome: Progressing   Problem: Safety: Goal: Ability to remain free from injury will improve Outcome: Progressing

## 2022-11-01 NOTE — Progress Notes (Signed)
Attempted Echocardiogram, Patient is unavailable, will attempt at a later time.

## 2022-11-01 NOTE — Discharge Instructions (Signed)
Patient given list of [facility]

## 2022-11-01 NOTE — Progress Notes (Signed)
   11/01/22 1624  Clinical Opiate Withdrawal Scale (COWS)  Resting Pulse Rate 2  Sweating 0  Restlessness 1  Pupil Size 0  Bone or Joint Aches 0  Runny Nose or Tearing 0  GI Upset 0  Tremor 0  Yawning 0  Anxiety or Irritability 1  Gooseflesh Skin 0  COWS Total Score 4    Scheduled medication administered.

## 2022-11-01 NOTE — Progress Notes (Addendum)
[facility] Progress Note Patient: Rick Taylor WUJ:[id number] DOB: Mar 25, 1990 DOA: 10/30/2022  DOS: the patient was seen and examined on 11/01/2022  Brief hospital course: PMH of polysubstance abuse presented to the hospital with complaints of right leg pain. Currently being treated for Enterococcus bacteremia as well as right leg cellulitis.  Assessment and Plan: Right leg cellulitis. E faecalis bacteremia. Concern for infective endocarditis. Cardiac murmur. Presents with complaints of right leg pain. Appears to have some evidence of infection. X-ray negative for any osteomyelitis but MRI ordered. Vascular evaluation shows adequate circulation. Orthopedic recommended local wound care for now. Blood culture came back positive for E faecalis. With history of IV drug abuse concern for infective endocarditis. Especially in the setting of cardiac murmur. Appreciate ID consultation. Echocardiogram TTE and TEE requested. Will monitor. Repeat cultures ordered. Had Pseudomonas in the past and the local infection workup although agree with ID to narrow the antibiotic to ampicillin for now.  Polysubstance abuse. 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 He is UDS is positive for amphetamine, opiates, cocaine, cannabis.  (UDS performed after receiving opiates in the hospital) Reports 3-4 times fentanyl use at home. At present patient is critically ill.  Most likely will require prolonged IV antibiotic in the hospital.  May require surgical debridement of for his wound as well. Has a heavy use of opioid use and currently appears to be undergoing withdrawal Patient is willing to stop abusing substances. Will offer Suboxone treatment in the hospital and monitor. Continue clonidine for withdrawal. BuSpar for anxiety.  Attempt for substance use in the hospital. Patient was caught attempting to inject substance in the hospital on 10/31. Security was called in, multiple syringes and  needles were identified in his personal belongings as well as different substance. Visitation restricted. Telemonitoring initiated.  Hyponatremia. Metabolic acidosis. Etiology not clear for now we will monitor.  Elevated LFT. AST less than ALT. Bilirubin normal. Again etiology not clear. Will monitor. Further workup pending trend.  Anemia. Most likely anemia of chronic disease. Hemoglobin dropped down to 6.9. Requiring blood transfusion. Fibrinogen normal.  INR 1.5.  Reticulocyte count elevated. Transfuse for hemoglobin less than 7.  Thrombocytopenia. Improving. For now monitor.   Moderate protein calorie malnutrition  Nutrition Problem: Moderate Malnutrition Etiology: social / environmental circumstances Nutrition Interventions: Interventions: Refer to RD note for recommendations Body mass index is 20.51 kg/m.    Subjective: No nausea no vomiting no fever no chills.  Was caught attempting to inject substance.  Reports shortness of breath.  Reports leg pain.  Reports difficulty sleeping.  Physical Exam: General: in severe distress, No Rash Cardiovascular: S1 and S2 Present, No Murmur Respiratory: Increased respiratory effort, Bilateral Air entry present. No Crackles, No wheezes Abdomen: Bowel Sound present, No tenderness Extremities: Bilateral lower extremity edema, right lower extremity wound seen. Neuro: Alert and oriented x3, no new focal deficit   Data Reviewed: I have Reviewed nursing notes, Vitals, and Lab results. Since last encounter, pertinent lab results CBC and CMP   . I have ordered test including CBC and CMP anemia panel  . I have discussed pt's care plan and test results with ID and cardiology  . I have ordered imaging echocardiogram  .   Disposition: Status is: Inpatient Remains inpatient appropriate because: Required IV antibiotic.   Family Communication: No one at bedside Level of care: Telemetry for now monitor on telemetry. Vitals:    11/01/22 0825 11/01/22 0902 11/01/22 1012 11/01/22 1220  BP: 118/66 119/63 121/61 109/79  Pulse:  100  (!) 104 (!) 110  Resp: 16  16 (!) 22  Temp: 98.6 F (37 C)  98.3 F (36.8 C) 98.2 F (36.8 C)  TempSrc: Oral  Oral Oral  SpO2: 98%   100%  Weight:      Height:       The patient is critically ill with multiple organ systems failure and requires high complexity decision making for assessment and support, frequent evaluation and titration of therapies. Critical Care Time devoted to patient care services described in this note is 35 minutes   Author: Lynden Oxford, MD 11/01/2022 6:10 PM  Please look on [email or web address] to find out who is on call.

## 2022-11-01 NOTE — Hospital Course (Addendum)
Brief hospital course: PMH of polysubstance abuse presented to the hospital with complaints of right leg pain. Currently being treated for Enterococcus bacteremia as well as right leg cellulitis. 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 Cardiology and cardiothoracic surgery (Dr. Leafy Ro) felt that the patient is not a surgical candidate.  Current plan is supportive care with IV antibiotics. Eventually need to be considered for debridement of right leg wound and reconsideration for CT surgery evaluation as he clinically improves and stay sober.  Assessment and Plan: Right leg cellulitis. E faecalis bacteremia. 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 Presents with complaints of right leg pain. X-ray and MRI negative for osteomyelitis or deep abscess but does show possibility of a cellulitis. ABI evaluation shows adequate circulation. Orthopedic recommended local wound care for now. Blood culture came back positive for E faecalis. Appreciate ID consultation. TTE shows evidence of preserved EF but torrential AI and severe MR with LV dilation. Cardiology was formally consulted. Initial plan was to transfer to [facility] for cardiothoracic surgery evaluation. Dr. Leafy Ro reviewed the chart and recommended that the patient is not a surgical candidate for now due to ongoing heroin use. Cardiology recommended to consider palliative care consultation and DNR. CT abdomen performed showed evidence of splenic and renal infarct as well. I believe that down the road patient should be reevaluated by CT surgery for consideration for surgical intervention.  Polysubstance abuse. 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 He is UDS is positive for amphetamine, opiates, cocaine, cannabis.  (UDS performed after receiving opiates in the hospital) Reports 3-4 times fentanyl use at home. At present patient is  critically ill.  Most likely will require prolonged IV antibiotic in the hospital.  May require surgical debridement of for his wound as well. Has a heavy use of opioid use and likely has undergone withdrawal.  Currently improving. Patient is willing to stop abusing substances. Continue Suboxone.  Continue clonidine.  Add clonazepam.  Continue Ativan as needed. Continue BuSpar for anxiety.  Acute on chronic diastolic CHF. In the setting of severe MR and AI. Management per cardiology.  Was receiving IV diuresis. Now switching to oral diuresis. Cardiology need to recommend timing for TEE for this patient for further workup of his infective endocarditis.  Right foot cellulitis.  No evidence of osteomyelitis/polymyositis/abscess/fasciitis. MRI resulted in 11/2.  Reassuring for no evidence of osteomyelitis. Orthopedic actually has been following in the hospital and I also earlier discussed with Dr. Lajoyce Corners on phone 11/1.   Based on cardiology and CT surgery recommendation currently further management of wound is conservative only.  Suspect will require local debridement once stable from cardiac perspective.  Will await cardiology clearance for timing of intervention.  Attempt for substance use in the hospital. Patient was caught attempting to inject substance in the hospital on 10/31. Security was called in, multiple syringes and needles were identified in his personal belongings as well as different substance. Visitation restricted. Telemonitoring initiated.  Hyponatremia. Metabolic acidosis. Etiology not clear for now we will monitor.  Elevated LFT. AST less than ALT. Bilirubin normal. Again etiology not clear. Will monitor. Further workup pending trend.  Anemia of chronic disease. Hemoglobin dropped down to 6.9. Requiring blood transfusion. Fibrinogen normal.  INR 1.5.  Reticulocyte count elevated. Transfuse for hemoglobin less than 8. Posttransfusion H&H is stable.     Thrombocytopenia. Improving. For now monitor.   Moderate protein calorie malnutrition  Nutrition Problem: Moderate Malnutrition Etiology: social /  environmental circumstances Nutrition Interventions: Interventions: Refer to RD note for recommendations Body mass index is 20.51 kg/m.   Goals of care conversation. Multiple discussion with the patient with regards to his goals of care. Patient appears to have severe aortic regurgitation as well as mitral regurgitation. Appears to be developing pulmonary edema. Appears to be at risk for cardiogenic shock. With ongoing withdrawal and ongoing substance abuse. Per CT surgery " per Dr. Leafy Ro, consulting CT surgeon: Ongoing heroin use and a chronic wound that would need to be dealt with way before he would ever be considered a surgical candidate and also would need to be off heroine" " Per cardiology recommended palliative care and DNR. I encouraged with the patient and mother on the phone. At present given his hemodynamic situation attempting CPR or intubation would not result in a good outcome for his quality of life. Patient currently understands this and agrees to transition to DNR DNI.  Mother also understands that. Will continue to diurese and supportive care. Cardiology will continue to follow to manage heart failure.  Coagulopathy. INR elevated 1.5 Improving with vitamin K.  Constipation Due to severe abdominal pain a CT abdomen was performed.  No acute abnormality seen other than some constipation. Continue bowel regimen.  Bilateral pleural effusion. CT chest shows evidence of bilateral pleural effusion.  Anticipating improvement with just diuresis as long as here continues to improve.  Repeat chest x-ray in a few days.

## 2022-11-01 NOTE — Progress Notes (Addendum)
   11/01/22 0727  Assess: MEWS Score  Temp (!) 97.4 F (36.3 C)  BP 113/66  MAP (mmHg) 79  Pulse Rate 100  ECG Heart Rate 100  Resp 18  Level of Consciousness Alert  SpO2 96 %  O2 Device Nasal Cannula  Patient Activity (if Appropriate) In bed  O2 Flow Rate (L/min) 2 L/min  Assess: MEWS Score  MEWS Temp 0  MEWS Systolic 0  MEWS Pulse 0  MEWS RR 0  MEWS LOC 0  MEWS Score 0  MEWS Score Color Green  Assess: if the MEWS score is Yellow or Red  Were vital signs accurate and taken at a resting state? Yes  Does the patient meet 2 or more of the SIRS criteria? Yes  Does the patient have a confirmed or suspected source of infection? Yes  MEWS guidelines implemented  Yes, yellow  Treat  MEWS Interventions Considered administering scheduled or prn medications/treatments as ordered  Take Vital Signs  Increase Vital Sign Frequency  Yellow: Q2hr x1, continue Q4hrs until patient remains green for 12hrs  Escalate  MEWS: Escalate Yellow: Discuss with charge nurse and consider notifying provider and/or RRT  Notify: Charge Nurse/RN  Name of Charge Nurse/RN Notified Rubin Payor RN/will discuss with RRT  Provider Notification  Provider Name/Title Allena Katz  Date Provider Notified 11/01/22  Time Provider Notified [phone]  Method of Notification Page  Assess: SIRS CRITERIA  SIRS Temperature  0  SIRS Pulse 1  SIRS Respirations  0  SIRS WBC 1  SIRS Score Sum  2    Discussed with RR RN Zoe.

## 2022-11-01 NOTE — Progress Notes (Signed)
   [facility] has been requested to perform a transesophageal echocardiogram on Rick Taylor for bacteremia.  After careful review of history and examination, the risks and benefits of transesophageal echocardiogram have been explained including risks of esophageal damage, perforation (1:10,000 risk), bleeding, pharyngeal hematoma as well as other potential complications associated with anesthesia including aspiration, arrhythmia, respiratory failure and death. Alternatives to treatment were discussed, questions were answered. Patient is willing to proceed.   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 WBC elevated, hgb anemic at 8.2 (down to 6.8 this morning). Blood culture positive. TEE tomorrow to rule out SBE. Will reassess hgb tomorrow AM. Otherwise, vital sign stable.   [place], [place] 11/01/2022 10:46 AM

## 2022-11-01 NOTE — TOC Progression Note (Signed)
Transition of Care [facility]) - Progression Note    Patient Details  Name: Rick Taylor MRN: [id number] Date of Birth: 1990/12/12  Transition of Care [facility]) CM/SW Contact  Adrian Prows, RN Phone Number: 11/01/2022, 11:09 AM  Clinical Narrative:    Sherron Monday w/ pt in room to complete TOC assessment; pt requested this RNCM to come back later; unable to complete assessment at this time.        Expected Discharge Plan and Services                                               Social Determinants of Health (SDOH) Interventions SDOH Screenings   Tobacco Use: High Risk (10/31/2022)    Readmission Risk Interventions     No data to display

## 2022-11-01 NOTE — Progress Notes (Signed)
Patient found actively trying to use drugs, in the restroom.   With needles, and found attempting to inject himself with drugs.

## 2022-11-01 NOTE — TOC Initial Note (Signed)
Transition of Care [facility]) - Initial/Assessment Note    Patient Details  Name: Rick Taylor MRN: [id number] Date of Birth: 10/03/1990  [facility]) CM/SW Contact:    Adrian Prows, RN Phone Number: 11/01/2022, 4:01 PM  Clinical Narrative:                 TOC for d/c planning; pt says he is homeless; he does not have insurance or PCP; pt identified POC [name] (aunt) [phone]; pt says he does not have transportation; he does not have HH services or home oxygen; pt agrees to receive resources for financial assistance, social services, IP/O SA counseling, and [employer]; he will make appts at agencies of choice; resources placed in d/c instructions; copies of resources also given to pt; TOC will follow.  Expected Discharge Plan: Home/Self Care Barriers to Discharge: Continued Medical Work up   Patient Goals and CMS Choice Patient states their goals for this hospitalization and ongoing recovery are:: patient says he is not sure where he will go at discharge          Expected Discharge Plan and Services   Discharge Planning Services: CM Consult   Living arrangements for the past 2 months: Homeless Shelter                                      Prior Living Arrangements/Services Living arrangements for the past 2 months: Homeless Shelter   Patient language and need for interpreter reviewed:: Yes        Need for Family Participation in Patient Care: Yes (Comment) Care giver support system in place?: Yes (comment) Current home services:  (n/a) Criminal Activity/Legal Involvement Pertinent to Current Situation/Hospitalization: No - Comment as needed  Activities of Daily Living      Permission Sought/Granted                  Emotional Assessment Appearance:: Appears stated age Attitude/Demeanor/Rapport: Gracious Affect (typically observed): Accepting Orientation: : Oriented to Self, Oriented to Place, Oriented to  Time,  Oriented to Situation Alcohol / Substance Use: Illicit Drugs Psych Involvement: No (comment)  Admission diagnosis:  Cellulitis of right lower extremity [L03.115] Sepsis due to cellulitis (HCC) [L03.90, A41.9] Sepsis, due to unspecified organism, unspecified whether acute organ dysfunction present [facility]) [A41.9] Patient Active Problem List   Diagnosis Date Noted   Sepsis due to cellulitis (HCC) 10/31/2022   Protein-calorie malnutrition, severe (HCC) 10/31/2022   Hyponatremia 10/31/2022   Abnormal LFTs 10/31/2022   Microcytic anemia 10/31/2022   Hyperglycemia 10/31/2022   Tobacco abuse 10/31/2022   Malnutrition of moderate degree 10/31/2022   Lower extremity injury 10/31/2022   Cellulitis of right lower extremity 10/31/2022   Acute opioid withdrawal (HCC) 10/31/2022   PCP:  Patient, No Pcp Per Pharmacy:   [id number] - [place], [place] - [address] AT [facility] [address] [place] [address] Phone: [phone] Fax: [phone]     Social Determinants of Health (SDOH) Social History: SDOH Screenings   Food Insecurity: Food Insecurity Present (11/01/2022)  Housing: High Risk (11/01/2022)  Transportation Needs: Unmet Transportation Needs (11/01/2022)  Utilities: Not At Risk (11/01/2022)  Tobacco Use: High Risk (10/31/2022)   SDOH Interventions: Food Insecurity Interventions: Inpatient TOC, Other (Comment) (resource given to patient) Housing Interventions: Inpatient TOC, Other (Comment) (resource given to pt) Transportation Interventions: Inpatient TOC Utilities Interventions:  Intervention Not Indicated, Inpatient TOC   Readmission Risk Interventions    11/01/2022    3:59 PM  Readmission Risk Prevention Plan  Transportation Screening Complete  PCP or Specialist Appt within 5-7 Days Complete  Home Care Screening Complete  Medication Review (RN CM) Complete

## 2022-11-01 NOTE — Progress Notes (Signed)
Belongings were taken, patient consents to be stored in security office.

## 2022-11-02 ENCOUNTER — Encounter (HOSPITAL_COMMUNITY): Payer: Self-pay | Admitting: Anesthesiology

## 2022-11-02 ENCOUNTER — Encounter (HOSPITAL_COMMUNITY): Payer: Self-pay | Admitting: Physician Assistant

## 2022-11-02 ENCOUNTER — Other Ambulatory Visit (HOSPITAL_COMMUNITY): Payer: Self-pay

## 2022-11-02 ENCOUNTER — Inpatient Hospital Stay (HOSPITAL_COMMUNITY): Payer: Self-pay

## 2022-11-02 ENCOUNTER — Encounter (HOSPITAL_COMMUNITY): Admission: EM | Disposition: E | Payer: Self-pay | Source: Home / Self Care | Attending: Internal Medicine

## 2022-11-02 DIAGNOSIS — R7881 Bacteremia: Secondary | ICD-10-CM

## 2022-11-02 LAB — IRON AND TIBC
Iron: 26 ug/dL — ABNORMAL LOW (ref 45–182)
Saturation Ratios: 12 % — ABNORMAL LOW (ref 17.9–39.5)
TIBC: 218 ug/dL — ABNORMAL LOW (ref 250–450)
UIBC: 192 ug/dL

## 2022-11-02 LAB — CBC WITH DIFFERENTIAL/PLATELET
Abs Immature Granulocytes: 0.27 10*3/uL — ABNORMAL HIGH (ref 0.00–0.07)
Basophils Absolute: 0 10*3/uL (ref 0.0–0.1)
Basophils Relative: 0 %
Eosinophils Absolute: 0 10*3/uL (ref 0.0–0.5)
Eosinophils Relative: 0 %
HCT: 28 % — ABNORMAL LOW (ref 39.0–52.0)
Hemoglobin: 8.8 g/dL — ABNORMAL LOW (ref 13.0–17.0)
Immature Granulocytes: 1 %
Lymphocytes Relative: 6 %
Lymphs Abs: 1.4 10*3/uL (ref 0.7–4.0)
MCH: 24.3 pg — ABNORMAL LOW (ref 26.0–34.0)
MCHC: 31.4 g/dL (ref 30.0–36.0)
MCV: 77.3 fL — ABNORMAL LOW (ref 80.0–100.0)
Monocytes Absolute: 0.7 10*3/uL (ref 0.1–1.0)
Monocytes Relative: 3 %
Neutro Abs: 20.3 10*3/uL — ABNORMAL HIGH (ref 1.7–7.7)
Neutrophils Relative %: 90 %
Platelets: 189 10*3/uL (ref 150–400)
RBC: 3.62 MIL/uL — ABNORMAL LOW (ref 4.22–5.81)
RDW: 20.3 % — ABNORMAL HIGH (ref 11.5–15.5)
WBC: 22.7 10*3/uL — ABNORMAL HIGH (ref 4.0–10.5)
nRBC: 0.1 % (ref 0.0–0.2)

## 2022-11-02 LAB — ECHOCARDIOGRAM COMPLETE
Est EF: 55
Height: 73 in
MV M vel: 4.7 m/s
MV Peak grad: 88.4 mm[Hg]
P 1/2 time: 96 ms
S' Lateral: 3.8 cm
Weight: 2486.79 [oz_av]

## 2022-11-02 LAB — CULTURE, BLOOD (ROUTINE X 2)
Special Requests: ADEQUATE
Special Requests: ADEQUATE

## 2022-11-02 LAB — COMPREHENSIVE METABOLIC PANEL
ALT: 85 U/L — ABNORMAL HIGH (ref 0–44)
AST: 38 U/L (ref 15–41)
Albumin: 2.1 g/dL — ABNORMAL LOW (ref 3.5–5.0)
Alkaline Phosphatase: 124 U/L (ref 38–126)
Anion gap: 11 (ref 5–15)
BUN: 20 mg/dL (ref 6–20)
CO2: 20 mmol/L — ABNORMAL LOW (ref 22–32)
Calcium: 7.8 mg/dL — ABNORMAL LOW (ref 8.9–10.3)
Chloride: 103 mmol/L (ref 98–111)
Creatinine, Ser: 0.6 mg/dL — ABNORMAL LOW (ref 0.61–1.24)
GFR, Estimated: >60 mL/min (ref >60)
Glucose, Bld: 122 mg/dL — ABNORMAL HIGH (ref 70–99)
Potassium: 3.4 mmol/L — ABNORMAL LOW (ref 3.5–5.1)
Sodium: 134 mmol/L — ABNORMAL LOW (ref 135–145)
Total Bilirubin: 1.2 mg/dL (ref 0.3–1.2)
Total Protein: 6.1 g/dL — ABNORMAL LOW (ref 6.5–8.1)

## 2022-11-02 LAB — TYPE AND SCREEN
ABO/RH(D): A POS
Antibody Screen: NEGATIVE
Unit division: 0

## 2022-11-02 LAB — C-REACTIVE PROTEIN: CRP: 10.9 mg/dL — ABNORMAL HIGH (ref <1.0)

## 2022-11-02 LAB — BPAM RBC
Blood Product Expiration Date: 202411212359
ISSUE DATE / TIME: 202410310802
Unit Type and Rh: 6200

## 2022-11-02 LAB — LACTIC ACID, PLASMA: Lactic Acid, Venous: 1.8 mmol/L (ref 0.5–1.9)

## 2022-11-02 LAB — VITAMIN B12: Vitamin B-12: 371 pg/mL (ref 180–914)

## 2022-11-02 LAB — GLUCOSE, CAPILLARY: Glucose-Capillary: 164 mg/dL — ABNORMAL HIGH (ref 70–99)

## 2022-11-02 LAB — PROCALCITONIN: Procalcitonin: 0.75 ng/mL

## 2022-11-02 SURGERY — TRANSESOPHAGEAL ECHOCARDIOGRAM (TEE) (CATHLAB)
Anesthesia: Monitor Anesthesia Care

## 2022-11-02 MED ORDER — FUROSEMIDE 10 MG/ML IJ SOLN
20.0000 mg | Freq: Two times a day (BID) | INTRAMUSCULAR | Status: DC
  Administered 2022-11-02 – 2022-11-03 (×2): 20 mg via INTRAVENOUS
  Filled 2022-11-02: qty 2

## 2022-11-02 MED ORDER — VITAMIN B-12 1000 MCG PO TABS
1000.0000 ug | ORAL_TABLET | Freq: Every day | ORAL | Status: DC
  Administered 2022-11-03 – 2022-11-17 (×15): 1000 ug via ORAL
  Filled 2022-11-02 (×15): qty 1

## 2022-11-02 MED ORDER — FUROSEMIDE 10 MG/ML IJ SOLN
20.0000 mg | Freq: Once | INTRAMUSCULAR | Status: AC
  Administered 2022-11-02: 20 mg via INTRAVENOUS
  Filled 2022-11-02: qty 2

## 2022-11-02 MED ORDER — CYANOCOBALAMIN 1000 MCG/ML IJ SOLN
1000.0000 ug | Freq: Once | INTRAMUSCULAR | Status: AC
  Administered 2022-11-02: 1000 ug via SUBCUTANEOUS
  Filled 2022-11-02: qty 1

## 2022-11-02 MED ORDER — SODIUM CHLORIDE 0.9 % IV SOLN
2.0000 g | Freq: Two times a day (BID) | INTRAVENOUS | Status: DC
  Administered 2022-11-02 – 2022-11-11 (×19): 2 g via INTRAVENOUS
  Filled 2022-11-02 (×19): qty 20

## 2022-11-02 MED ORDER — CLONIDINE HCL 0.1 MG PO TABS
0.1000 mg | ORAL_TABLET | Freq: Every day | ORAL | Status: DC
  Administered 2022-11-02 – 2022-11-06 (×5): 0.1 mg via ORAL
  Filled 2022-11-02 (×5): qty 1

## 2022-11-02 MED ORDER — HYDROXYZINE HCL 25 MG PO TABS
25.0000 mg | ORAL_TABLET | Freq: Three times a day (TID) | ORAL | Status: DC | PRN
  Administered 2022-11-02 – 2022-11-17 (×4): 25 mg via ORAL
  Filled 2022-11-02 (×4): qty 1

## 2022-11-02 MED ORDER — LORAZEPAM 0.5 MG PO TABS
0.5000 mg | ORAL_TABLET | ORAL | Status: DC | PRN
  Administered 2022-11-02 – 2022-11-17 (×6): 0.5 mg via ORAL
  Filled 2022-11-02 (×7): qty 1

## 2022-11-02 MED ORDER — POTASSIUM CHLORIDE CRYS ER 20 MEQ PO TBCR
40.0000 meq | EXTENDED_RELEASE_TABLET | Freq: Once | ORAL | Status: AC
  Administered 2022-11-02: 40 meq via ORAL
  Filled 2022-11-02: qty 2

## 2022-11-02 MED ORDER — ENOXAPARIN SODIUM 40 MG/0.4ML IJ SOSY: 40.0000 mg | PREFILLED_SYRINGE | INTRAMUSCULAR | Status: DC

## 2022-11-02 MED ORDER — CLONAZEPAM 1 MG PO TABS
1.0000 mg | ORAL_TABLET | Freq: Two times a day (BID) | ORAL | Status: DC
  Administered 2022-11-02 – 2022-11-17 (×31): 1 mg via ORAL
  Filled 2022-11-02 (×31): qty 1

## 2022-11-02 MED ORDER — IPRATROPIUM-ALBUTEROL 0.5-2.5 (3) MG/3ML IN SOLN
3.0000 mL | Freq: Four times a day (QID) | RESPIRATORY_TRACT | Status: DC | PRN
  Administered 2022-11-02 – 2022-11-17 (×2): 3 mL via RESPIRATORY_TRACT
  Filled 2022-11-02 (×2): qty 3

## 2022-11-02 MED ORDER — ENOXAPARIN SODIUM 40 MG/0.4ML IJ SOSY
40.0000 mg | PREFILLED_SYRINGE | INTRAMUSCULAR | Status: DC
  Administered 2022-11-03 – 2022-11-17 (×9): 40 mg via SUBCUTANEOUS
  Filled 2022-11-02 (×14): qty 0.4

## 2022-11-02 MED ORDER — LORAZEPAM 2 MG/ML IJ SOLN
0.5000 mg | Freq: Once | INTRAMUSCULAR | Status: AC
  Administered 2022-11-02: 0.5 mg via INTRAVENOUS
  Filled 2022-11-02: qty 1

## 2022-11-02 MED ORDER — PHYTONADIONE 5 MG PO TABS
2.5000 mg | ORAL_TABLET | Freq: Once | ORAL | Status: AC
  Administered 2022-11-02: 2.5 mg via ORAL
  Filled 2022-11-02: qty 1

## 2022-11-02 NOTE — Anesthesia Preprocedure Evaluation (Signed)
Anesthesia Evaluation    Reviewed: Allergy & Precautions, Patient's Chart, lab work & pertinent test results  Airway        Dental   Pulmonary Current Smoker chest x-ray-mild to moderate interstitial edema with a basal gradient and at least small sized underlying layering pleural effusion.  Perihilar and bibasilar patchy opacities consistent with alveolar edema vs pneumonia or combination.          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 He is UDS is positive for amphetamine, opiates, cocaine, cannabis.  On clonidine, suboxone and buspar for withdrawal    Endo/Other  negative endocrine ROS    Renal/GU negative Renal ROS     Musculoskeletal negative musculoskeletal ROS (+)  narcotic dependent  Abdominal   Peds  Hematology  (+) Blood dyscrasia, anemia Hb 8.8   Anesthesia Other Findings being treated for Enterococcus bacteremia as well as right leg cellulitis.  Reproductive/Obstetrics                             Anesthesia Physical Anesthesia Plan  ASA: 3  Anesthesia Plan: MAC   Post-op Pain Management:    Induction:   PONV Risk Score and Plan: 2 and Propofol infusion and TIVA  Airway Management Planned: Natural Airway and Simple Face Mask  Additional Equipment: None  Intra-op Plan:   Post-operative Plan:   Informed Consent: I have reviewed the patients History and Physical, chart, labs and discussed the procedure including the risks, benefits and alternatives for the proposed anesthesia with the patient or authorized representative who has indicated his/her understanding and acceptance.       Plan Discussed with:  CRNA  Anesthesia Plan Comments:        Anesthesia Quick Evaluation

## 2022-11-02 NOTE — Progress Notes (Signed)
    Patient Name: Rick Taylor           DOB: 10-05-1990  MRN: [id number]      Admission Date: 10/30/2022  Attending Provider: Rolly Salter, MD  Primary Diagnosis: Sepsis due to cellulitis [facility])   Level of care: Telemetry    CROSS COVER NOTE   Date of Service   11/02/2022   Rick Taylor, 32 y.o. male, was admitted on 10/30/2022 for Sepsis due to cellulitis [facility]).    HPI/Events of Note   Dyspnea  RN reports patient is complaining of SOB.  Per nursing assessment, patient is tachypneic but O2 sats WNL.  Some increased WOB noted.   RN placed patient on nonrebreather for 10 minutes, some improvement reported by patient.   Bedside assessment: Patient is alert and oriented x 4.  While resting/sleeping patient appears comfortable, however once woken up he is anxious and endorses feeling short of breath.  Patient has been afebrile and denies chills, cough, sputum production, or chest discomfort.  Breath sounds are clear and diminished at the bases, left lung more diminished than right.  No accessory muscle use noted.  +2 pitting edema at BLE.  Furthermore, patient is also restless, anxious, diaphoretic, and has mild tremors in bilateral upper extremities.  Symptoms coincide with opioid withdrawal.    Addendum 0200-chest x-ray-mild to moderate interstitial edema with a basal gradient and at least small sized underlying layering pleural effusion.  Perihilar and bibasilar patchy opacities consistent with alveolar edema vs pneumonia or combination.  Orders placed for IV Lasix and labs. Consider abx for PNA if procal is elevated. Currently on ampicillin for wound infection, received 1x vanc on admission.    Interventions/ Plan   Chest x-ray Neb treatment as needed IV Lasix, 20 mg Labs-CBC, CMP, procalcitonin, lactic acid, CRP        Anthoney Harada, DNP, ACNPC- AG [facility] [place]

## 2022-11-02 NOTE — Progress Notes (Addendum)
[facility] Progress Note Patient: Rick Taylor UXL:[id number] DOB: 07-31-1990 DOA: 10/30/2022  DOS: the patient was seen and examined on 11/02/2022  Brief hospital course: PMH of polysubstance abuse presented to the hospital with complaints of right leg pain. Currently being treated for Enterococcus bacteremia as well as right leg cellulitis. 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 Cardiology and cardiothoracic surgery (Dr. Leafy Ro) felt that the patient is not a surgical candidate.  Current plan is supportive care with IV antibiotics.  Assessment and Plan: Right leg cellulitis. E faecalis bacteremia. Concern for infective endocarditis. Cardiac murmur. Presents with complaints of right leg pain. Appears to have some evidence of infection. X-ray negative for any osteomyelitis but MRI ordered. Vascular evaluation shows adequate circulation. Orthopedic recommended local wound care for now. Blood culture came back positive for E faecalis. Appreciate ID consultation. TTE shows evidence of preserved EF but torrential AI and severe MR with LV dilation. Cardiology was formally consulted. Initial plan was to transfer to [facility] for cardiothoracic surgery evaluation. Dr. Leafy Ro reviewed the chart and recommended that the patient is not a surgical candidate for now due to ongoing heroin use. Cardiology recommended to consider palliative care consultation and DNR.  Polysubstance abuse. 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 He is UDS is positive for amphetamine, opiates, cocaine, cannabis.  (UDS performed after receiving opiates in the hospital) Reports 3-4 times fentanyl use at home. At present patient is critically ill.  Most likely will require prolonged IV antibiotic in the hospital.  May require surgical debridement of for his wound as well. Has a heavy use of opioid use and currently appears to be undergoing  withdrawal Patient is willing to stop abusing substances. Continue Suboxone.  Continue clonidine.  Add clonazepam.  Continue Ativan as needed. Continue BuSpar for anxiety.  Acute on chronic diastolic CHF. In the setting of severe MR and AI. Management per cardiology.  Currently receiving IV diuresis.  Right foot infection. Concern for osteomyelitis but Had repeat MRI on 11/1. Will follow-up on results. Discussed with Dr. Lajoyce Corners on phone.  Based on cardiology and CT surgery recommendation currently further management of wound is conservative only.  Will monitor progression.  Attempt for substance use in the hospital. Patient was caught attempting to inject substance in the hospital on 10/31. Security was called in, multiple syringes and needles were identified in his personal belongings as well as different substance. Visitation restricted. Telemonitoring initiated.  Hyponatremia. Metabolic acidosis. Etiology not clear for now we will monitor.  Elevated LFT. AST less than ALT. Bilirubin normal. Again etiology not clear. Will monitor. Further workup pending trend.  Anemia. Most likely anemia of chronic disease. Hemoglobin dropped down to 6.9. Requiring blood transfusion. Fibrinogen normal.  INR 1.5.  Reticulocyte count elevated. Transfuse for hemoglobin less than 7.  Thrombocytopenia. Improving. For now monitor.   Moderate protein calorie malnutrition  Nutrition Problem: Moderate Malnutrition Etiology: social / environmental circumstances Nutrition Interventions: Interventions: Refer to RD note for recommendations Body mass index is 20.51 kg/m.   Goals of care conversation. Multiple discussion with the patient with regards to his goals of care. Patient appears to have severe aortic regurgitation as well as mitral regurgitation. Appears to be developing pulmonary edema. Appears to be at risk for cardiogenic shock. With ongoing withdrawal and ongoing substance  abuse. Per CT surgery " per Dr. Leafy Ro, consulting CT surgeon: Ongoing heroin use and a chronic wound that would need to be dealt with  way before he would ever be considered a surgical candidate and also would need to be off heroine" " Per cardiology recommended palliative care and DNR. I encouraged with the patient and mother on the phone. At present given his hemodynamic situation attempting CPR or intubation would not result in a good outcome for his quality of life. Patient currently understands this and agrees to transition to DNR DNI.  Mother also understands that. Will continue to diurese and supportive care. Cardiology will continue to follow to manage heart failure.   Subjective: No nausea no vomiting no fever no chills.  Multiple conversive with the family.  I have been at bedside to discuss with the patient 4 times today.  Also discussed with mother on the phone twice.  Patient reports short of breath and wants oxygen but not hypoxic.  Physical Exam: General: in severe distress, No Rash Cardiovascular: S1 and S2 Present, No Murmur Respiratory: Increased respiratory effort, Bilateral Air entry present.  Faint basal crackles, No wheezes Abdomen: Bowel Sound present, No tenderness Extremities: Improving edema Neuro: Alert and oriented x3, no new focal deficit  Data Reviewed: I have Reviewed nursing notes, Vitals, and Lab results. Since last encounter, pertinent lab results CBC and CMP   . I have ordered test including CBC and CMP  . I have discussed pt's care plan and test results with CT surgery, orthopedics, cardiology, ID  .   Disposition: Status is: Inpatient Remains inpatient appropriate because: Monitor for stabilization  enoxaparin (LOVENOX) injection 40 mg Start: 11/03/22 1000   Family Communication: Discussed with mother on the phone Level of care: Progressive   Vitals:   11/02/22 1425 11/02/22 1447 11/02/22 1620 11/02/22 1712  BP: 131/73 104/63 117/64 (!) 154/84   Pulse: (!) 105 (!) 112 (!) 109 (!) 109  Resp:  (!) 44 (!) 48   Temp:  98.4 F (36.9 C) (!) 97.5 F (36.4 C) 97.8 F (36.6 C)  TempSrc:  Oral Oral Oral  SpO2: 96% 100% 100% 100%  Weight:      Height:        The patient is critically ill with multiple organ systems failure and requires high complexity decision making for assessment and support, frequent evaluation and titration of therapies. Critical Care Time devoted to patient care services described in this note is 35 minutes  Author: Lynden Oxford, MD 11/02/2022 6:18 PM  Please look on [email or web address] to find out who is on call.

## 2022-11-02 NOTE — Progress Notes (Signed)
Pt complaining of SOB. Pt very tachypnic and with increased work of breathing upon entering room. Pt on 2L [place], SpO2 WNL. O2 increased with minimal improvement so pt then placed on non rebreather. Pts respiratory status improved on non-rebreather after about 5-10 minutes and was placed back on nasal cannula O2. Pt lungs very diminished at bases on auscultation. Overnight provider Anthoney Harada, NP made aware and provider up to bedside to assess pt. Orders placed for STAT chest x-ray along with scheduled duonebs. Duoneb given with mild improvement. Will continue to closely monitor

## 2022-11-02 NOTE — Interval H&P Note (Signed)
History and Physical Interval Note:  11/02/2022 9:28 AM  Rick Taylor  has presented today for surgery, with the diagnosis of Bacteremia, IVDU.  The various methods of treatment have been discussed with the patient and family. After consideration of risks, benefits and other options for treatment, the patient has consented to  Procedure(s): TRANSESOPHAGEAL ECHOCARDIOGRAM (N/A) as a surgical intervention.  The patient's history has been reviewed, patient examined, no change in status, stable for surgery.  I have reviewed the patient's chart and labs.  Questions were answered to the patient's satisfaction.     Ossie Beltran

## 2022-11-02 NOTE — Progress Notes (Signed)
Chaplain engaged in an initial visit with Rick Taylor. Chaplain was able to lead Rick Taylor in a prayer of salvation and repentance at his request. Lunette Stands was also able to baptize him with anointing oil as he desired. Chaplain provided support, meaningful spiritual practice, compassion, and reflective listening.    11/02/22 1300  Spiritual Encounters  Type of Visit Initial  Care provided to: Patient  Conversation partners present during encounter Nurse  Reason for visit End-of-life  Spiritual Framework  Presenting Themes Rituals and practive  Interventions  Spiritual Care Interventions Made Prayer;Supported grief process;Established relationship of care and support;Compassionate presence;Reflective listening;Meaning making  Intervention Outcomes  Outcomes Awareness of support;Connection to spiritual care

## 2022-11-02 NOTE — Progress Notes (Addendum)
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 Ortho evaluated --> local wound care. Will get mri too as if suggestion of OM would like to diagnose/manage earlier to prevent nidus and recurrent infection   High burden e faecalis community acquired bacteremia. Ivdu. High risk endocarditis. Would need ie r/o. Initial source ?wound vs ivdu   ---------- 11/1 assessment Tte showed avr severe, and av vegetation; moderate severe mr; moderately reduced lv function Ext edema only and dyspnea clinically chf (on oxygen) High risk mortality. If not a valve replacement candidate, unlikely to survive Patient to be transferred to [facility] for cts consultation   Plan: Rhae Hammock today? Transfering to [facility] Add ceftriaxone to ampicillin Await read on right tib-fib mri - if sign of om will probably need to evaluate/manage this admission to prep in case he get valve replacement F/u repeat bcx Continue wound care rle Discussed with primary team   ---------------- Addendum Cts had reviewed case and stated at this time patient not surgical candidate (I wasn't involved in entire conversation and not sure what criteria they use)  From id standpoint I would still treat for the 6 weeks endocarditis Would optimize chf management Would still need tee to help with prognosis (look for myocardial abscess)  If he survives the medical treatment duration, would need to follow up with CTS and cardiology again to review valve repair/replacement at that time  Principal Problem:   Sepsis due to cellulitis  [facility]) Active Problems:   Protein-calorie malnutrition, severe (HCC)   Hyponatremia   Abnormal LFTs   Microcytic anemia   Hyperglycemia   Tobacco abuse   Malnutrition of moderate degree   Lower extremity injury   Cellulitis of right lower extremity   Acute opioid withdrawal (HCC)   No Known Allergies  Scheduled Meds:  ascorbic acid  500 mg Oral BID   buprenorphine-naloxone  1 tablet Sublingual BID   busPIRone  10 mg Oral BID   Chlorhexidine Gluconate Cloth  6 each Topical Q0600   cloNIDine  0.1 mg Oral Daily   collagenase   Topical Daily   [START ON 11/03/2022] vitamin B-12  1,000 mcg Oral Daily   [START ON 11/03/2022] enoxaparin (LOVENOX) injection  40 mg Subcutaneous Q24H   feeding supplement  237 mL Oral TID BM   ferrous sulfate  325 mg Oral Q breakfast   methocarbamol  500 mg Oral TID   multivitamin with minerals  1 tablet Oral Daily   mupirocin ointment  1 Application Nasal BID   nicotine  21 mg Transdermal Q24H   zinc sulfate  220 mg Oral Daily   Continuous Infusions:  ampicillin (OMNIPEN) IV 2 g (11/02/22 1310)   PRN Meds:.acetaminophen **OR** acetaminophen, buprenorphine-naloxone, dicyclomine, hydrOXYzine, ipratropium-albuterol, loperamide, LORazepam,  ondansetron **OR** ondansetron (ZOFRAN) IV, oxyCODONE **OR** oxyCODONE   SUBJECTIVE: No clinical change Stable dyspnea Afebrile No focal pain outside of rle  Tte finding discussed  Review of Systems: ROS All other ROS was negative, except mentioned above     OBJECTIVE: Vitals:   11/01/22 1220 11/01/22 2121 11/02/22 0417 11/02/22 0843  BP: 109/79 118/71 123/75   Pulse: (!) 110 (!) 108    Resp: (!) 22 20 20    Temp: 98.2 F (36.8 C) 98.2 F (36.8 C) (!) 97.5 F (36.4 C)   TempSrc: Oral Oral Oral   SpO2: 100% 100% 97% 93%  Weight:      Height:       Body mass index is 20.51 kg/m.  Physical Exam General/constitutional: no distress, pleasant, appears mildly dyspneic with long sentences. On  nrb HEENT: Normocephalic, PER, Conj Clear, EOMI, Oropharynx clear Neck supple CV: rrr systolic murmur Lungs: clear to auscultation, normal respiratory effort Abd: Soft, Nontender Ext: stable trace bilateral pedal edema Skin: no cellulitis around RLE wound   Lab Results Lab Results  Component Value Date   WBC 22.7 (H) 11/02/2022   HGB 8.8 (L) 11/02/2022   HCT 28.0 (L) 11/02/2022   MCV 77.3 (L) 11/02/2022   PLT 189 11/02/2022    Lab Results  Component Value Date   CREATININE 0.60 (L) 11/02/2022   BUN 20 11/02/2022   NA 134 (L) 11/02/2022   K 3.4 (L) 11/02/2022   CL 103 11/02/2022   CO2 20 (L) 11/02/2022    Lab Results  Component Value Date   ALT 85 (H) 11/02/2022   AST 38 11/02/2022   ALKPHOS 124 11/02/2022   BILITOT 1.2 11/02/2022      Microbiology: Recent Results (from the past 240 hour(s))  Blood culture (routine x 2)     Status: Abnormal   Collection Time: 10/31/22  4:05 AM   Specimen: BLOOD RIGHT FOREARM  Result Value Ref Range Status   Specimen Description   Final    BLOOD RIGHT FOREARM Performed at [facility], [address] [place], [place] [address]    Special Requests   Final    BOTTLES DRAWN AEROBIC AND ANAEROBIC Blood Culture adequate volume Performed at [facility], [address] [place], [place] [address]    Culture  Setup Time   Final    GRAM POSITIVE COCCI IN PAIRS IN BOTH AEROBIC AND ANAEROBIC BOTTLES CRITICAL RESULT CALLED TO, READ BACK BY AND VERIFIED WITH: PHARMD C. AHMEND [id number] @ 1749 FH    Culture (A)  Final    ENTEROCOCCUS FAECALIS SUSCEPTIBILITIES PERFORMED ON PREVIOUS CULTURE WITHIN THE LAST 5 DAYS. Performed at [facility], [address], [place], [place] [address]    Report Status 11/02/2022 FINAL  Final  Blood culture (routine x 2)     Status: Abnormal   Collection Time: 10/31/22  4:05 AM   Specimen: BLOOD LEFT FOREARM  Result Value Ref Range Status   Specimen Description   Final     BLOOD LEFT FOREARM Performed at [facility], [address], [place], [place] [address]    Special Requests   Final    BOTTLES DRAWN AEROBIC AND ANAEROBIC Blood Culture adequate volume Performed at [facility], [address] [place], [place] [address]    Culture  Setup Time   Final    GRAM POSITIVE COCCI IN BOTH AEROBIC AND ANAEROBIC BOTTLES PHARMD A. [facility] [id number] @ 1745 FH Performed  at [facility], [address] [place], [place] [address]    Culture ENTEROCOCCUS FAECALIS (A)  Final   Report Status 11/02/2022 FINAL  Final   Organism ID, Bacteria ENTEROCOCCUS FAECALIS  Final      Susceptibility   Enterococcus faecalis - MIC*    AMPICILLIN <=2 SENSITIVE Sensitive     VANCOMYCIN 1 SENSITIVE Sensitive     GENTAMICIN SYNERGY SENSITIVE Sensitive     * ENTEROCOCCUS FAECALIS  Blood Culture ID Panel (Reflexed)     Status: Abnormal   Collection Time: 10/31/22  4:05 AM  Result Value Ref Range Status   Enterococcus faecalis DETECTED (A) NOT DETECTED Final    Comment: CRITICAL RESULT CALLED TO, READ BACK BY AND VERIFIED WITH: PHARMD A. PHAM [id number] @ 1745 FH    Enterococcus Faecium NOT DETECTED NOT DETECTED Final   Listeria monocytogenes NOT DETECTED NOT DETECTED Final   Staphylococcus species NOT DETECTED NOT DETECTED Final   Staphylococcus aureus (BCID) NOT DETECTED NOT DETECTED Final   Staphylococcus epidermidis NOT DETECTED NOT DETECTED Final   Staphylococcus lugdunensis NOT DETECTED NOT DETECTED Final   Streptococcus species NOT DETECTED NOT DETECTED Final   Streptococcus agalactiae NOT DETECTED NOT DETECTED Final   Streptococcus pneumoniae NOT DETECTED NOT DETECTED Final   Streptococcus pyogenes NOT DETECTED NOT DETECTED Final   A.calcoaceticus-baumannii NOT DETECTED NOT DETECTED Final   Bacteroides fragilis NOT DETECTED NOT DETECTED Final   Enterobacterales NOT DETECTED NOT DETECTED Final   Enterobacter cloacae complex NOT DETECTED NOT DETECTED  Final   Escherichia coli NOT DETECTED NOT DETECTED Final   Klebsiella aerogenes NOT DETECTED NOT DETECTED Final   Klebsiella oxytoca NOT DETECTED NOT DETECTED Final   Klebsiella pneumoniae NOT DETECTED NOT DETECTED Final   Proteus species NOT DETECTED NOT DETECTED Final   Salmonella species NOT DETECTED NOT DETECTED Final   Serratia marcescens NOT DETECTED NOT DETECTED Final   Haemophilus influenzae NOT DETECTED NOT DETECTED Final   Neisseria meningitidis NOT DETECTED NOT DETECTED Final   Pseudomonas aeruginosa NOT DETECTED NOT DETECTED Final   Stenotrophomonas maltophilia NOT DETECTED NOT DETECTED Final   Candida albicans NOT DETECTED NOT DETECTED Final   Candida auris NOT DETECTED NOT DETECTED Final   Candida glabrata NOT DETECTED NOT DETECTED Final   Candida krusei NOT DETECTED NOT DETECTED Final   Candida parapsilosis NOT DETECTED NOT DETECTED Final   Candida tropicalis NOT DETECTED NOT DETECTED Final   Cryptococcus neoformans/gattii NOT DETECTED NOT DETECTED Final   Vancomycin resistance NOT DETECTED NOT DETECTED Final    Comment: Performed at [facility], [address] [place], [place] [address]  MRSA Next Gen by PCR, Nasal     Status: Abnormal   Collection Time: 11/01/22 12:25 PM   Specimen: Nasal Mucosa; Nasal Swab  Result Value Ref Range Status   MRSA by PCR Next Gen DETECTED (A) NOT DETECTED Final    Comment: RESULT CALLED TO, READ BACK BY AND VERIFIED WITH: PECHETTE, F RN @ 1859 ON 11/01/2022 BY ABDULHALIM,M (NOTE) The GeneXpert MRSA Assay (FDA approved for NASAL specimens only), is one component of a comprehensive MRSA colonization surveillance program. It is not intended to diagnose MRSA infection nor to guide or monitor treatment for MRSA infections. Test performance is not FDA approved in patients less than 42 years old. Performed at [facility], [address] [place], [place] [address]      Serology:   Imaging: If present, new  imagings (plain films, ct  scans, and mri) have been personally visualized and interpreted; radiology reports have been reviewed. Decision making incorporated into the Impression / Recommendations.  11/1 tte  1. Left ventricular ejection fraction, by estimation, is 55%. The left  ventricle has no regional wall motion abnormalities. The left ventricular  internal cavity size was severely dilated. Left ventricular diastolic  parameters are indeterminate. Elevated   left ventricular end-diastolic pressure.   2. Right ventricular systolic function moderate to severely reduced. The  right ventricular size is mildly enlarged.   3. Left atrial size was mild to moderately dilated.   4. Right atrial size was mildly dilated.   5. A small pericardial effusion is present. Large pleural effusion.   6. Moderate to severe mitral valve regurgitation.   7. AV is thickened, leaflets appear shaggy There appears to be a  vegetation on left coronary cusp with destruction of the normal leaflet  function. Leaflets do not coapt. Deceleration time is very short at 96  msec. There is holosystolic backflow from the   descending aorta. Overall consistent with torrential AI.Marland Kitchen The aortic  valve is tricuspid. Aortic valve regurgitation is severe. Aortic  regurgitation PHT measures 96 msec.   8. The inferior vena cava is dilated in size with >50% respiratory  variability, suggesting right atrial pressure of 8 mmHg.   Raymondo Band, MD [facility] [phone] pager    11/02/2022, 1:37 PM

## 2022-11-02 NOTE — Consult Note (Addendum)
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  I do not believe that he is a candidate for surgery given his ongoing polysubstance abuse, chronic lower extremity wound.  The plan in place has been to transfer him to [facility] to have a cardiothoracic surgery consultation which has been placed.  I spoke to primary attending Dr. Allena Katz about this.  This is not unreasonable to get their ultimate opinion.  If this is the case, recommend continued palliation.  Donato Schultz, MD  ADDENDUM: Case has been reviewed by cardiothoracic surgery, Dr. Leafy Ro.  He has been confirmed not a surgical candidate due to ongoing chronic lower extremity wound infection with ongoing polysubstance abuse.  Without those  issues resolved, he would not be a surgical candidate.  Appreciate the update.  Encourage palliative care consultation. Continue with palliation with IV lasix.   Donato Schultz, MD

## 2022-11-02 NOTE — Plan of Care (Signed)
  Problem: Clinical Measurements: Goal: Ability to maintain clinical measurements within normal limits will improve Outcome: Progressing   Problem: Nutrition: Goal: Adequate nutrition will be maintained Outcome: Progressing   Problem: Safety: Goal: Ability to remain free from injury will improve Outcome: Progressing   Problem: Coping: Goal: Level of anxiety will decrease Outcome: Not Progressing   Problem: Pain Management: Goal: General experience of comfort will improve Outcome: Not Progressing   Problem: Skin Integrity: Goal: Risk for impaired skin integrity will decrease Outcome: Not Progressing

## 2022-11-02 NOTE — Progress Notes (Signed)
  Echocardiogram 2D Echocardiogram has been performed.  Milda Smart 11/02/2022, 8:51 AM

## 2022-11-02 DEATH — deceased

## 2022-11-03 ENCOUNTER — Inpatient Hospital Stay (HOSPITAL_COMMUNITY): Payer: Self-pay

## 2022-11-03 LAB — CBC
HCT: 28.1 % — ABNORMAL LOW (ref 39.0–52.0)
Hemoglobin: 8.5 g/dL — ABNORMAL LOW (ref 13.0–17.0)
MCH: 24.4 pg — ABNORMAL LOW (ref 26.0–34.0)
MCHC: 30.2 g/dL (ref 30.0–36.0)
MCV: 80.5 fL (ref 80.0–100.0)
Platelets: 227 10*3/uL (ref 150–400)
RBC: 3.49 MIL/uL — ABNORMAL LOW (ref 4.22–5.81)
RDW: 21.4 % — ABNORMAL HIGH (ref 11.5–15.5)
WBC: 20.7 10*3/uL — ABNORMAL HIGH (ref 4.0–10.5)
nRBC: 0 % (ref 0.0–0.2)

## 2022-11-03 LAB — BASIC METABOLIC PANEL
Anion gap: 11 (ref 5–15)
BUN: 23 mg/dL — ABNORMAL HIGH (ref 6–20)
CO2: 20 mmol/L — ABNORMAL LOW (ref 22–32)
Calcium: 7.7 mg/dL — ABNORMAL LOW (ref 8.9–10.3)
Chloride: 101 mmol/L (ref 98–111)
Creatinine, Ser: 0.66 mg/dL (ref 0.61–1.24)
GFR, Estimated: >60 mL/min (ref >60)
Glucose, Bld: 119 mg/dL — ABNORMAL HIGH (ref 70–99)
Potassium: 3.9 mmol/L (ref 3.5–5.1)
Sodium: 132 mmol/L — ABNORMAL LOW (ref 135–145)

## 2022-11-03 LAB — MAGNESIUM: Magnesium: 2 mg/dL (ref 1.7–2.4)

## 2022-11-03 LAB — BRAIN NATRIURETIC PEPTIDE: B Natriuretic Peptide: >4500 pg/mL — ABNORMAL HIGH (ref 0.0–100.0)

## 2022-11-03 MED ORDER — FUROSEMIDE 10 MG/ML IJ SOLN
40.0000 mg | Freq: Two times a day (BID) | INTRAMUSCULAR | Status: DC
Start: 2022-11-03 — End: 2022-11-04
  Administered 2022-11-03: 40 mg via INTRAVENOUS
  Filled 2022-11-03: qty 4

## 2022-11-03 MED ORDER — FUROSEMIDE 10 MG/ML IJ SOLN
20.0000 mg | Freq: Once | INTRAMUSCULAR | Status: AC
  Administered 2022-11-03: 20 mg via INTRAVENOUS
  Filled 2022-11-03: qty 2

## 2022-11-03 MED ORDER — FUROSEMIDE 10 MG/ML IJ SOLN: 20.0000 mg | Freq: Once | INTRAMUSCULAR | Status: DC

## 2022-11-03 MED ORDER — POTASSIUM CHLORIDE CRYS ER 20 MEQ PO TBCR: 40.0000 meq | EXTENDED_RELEASE_TABLET | Freq: Four times a day (QID) | ORAL | Status: DC

## 2022-11-03 MED ORDER — IOHEXOL 300 MG/ML  SOLN
100.0000 mL | Freq: Once | INTRAMUSCULAR | Status: AC | PRN
  Administered 2022-11-03: 100 mL via INTRAVENOUS

## 2022-11-03 NOTE — Plan of Care (Signed)
  Problem: Education: Goal: Knowledge of General Education information will improve Description: Including pain rating scale, medication(s)/side effects and non-pharmacologic comfort measures Outcome: Progressing   Problem: Clinical Measurements: Goal: Ability to maintain clinical measurements within normal limits will improve Outcome: Progressing   Problem: Safety: Goal: Ability to remain free from injury will improve Outcome: Progressing   

## 2022-11-03 NOTE — Progress Notes (Addendum)
[facility] Progress Note Patient: Rick Taylor ZOX:[id number] DOB: 1990-10-08 DOA: 10/30/2022  DOS: the patient was seen and examined on 11/03/2022  Brief hospital course: PMH of polysubstance abuse presented to the hospital with complaints of right leg pain. Currently being treated for Enterococcus bacteremia as well as right leg cellulitis. 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 Cardiology and cardiothoracic surgery (Dr. Leafy Ro) felt that the patient is not a surgical candidate.  Current plan is supportive care with IV antibiotics.  Assessment and Plan: Right leg cellulitis. E faecalis bacteremia. Infective endocarditis with complication. Presents with complaints of right leg pain. Appears to have some evidence of infection. X-ray negative for any osteomyelitis but MRI ordered. Vascular evaluation shows adequate circulation. Orthopedic recommended local wound care for now. Blood culture came back positive for E faecalis. Appreciate ID consultation. TTE shows evidence of preserved EF but torrential AI and severe MR with LV dilation. Cardiology was formally consulted. Initial plan was to transfer to [facility] for cardiothoracic surgery evaluation. Dr. Leafy Ro reviewed the chart and recommended that the patient is not a surgical candidate for now due to ongoing heroin use. Cardiology recommended to consider palliative care consultation and DNR.  Polysubstance abuse. 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 He is UDS is positive for amphetamine, opiates, cocaine, cannabis.  (UDS performed after receiving opiates in the hospital) Reports 3-4 times fentanyl use at home. At present patient is critically ill.  Most likely will require prolonged IV antibiotic in the hospital.  May require surgical debridement of for his wound as well. Has a heavy use of opioid use and currently appears to be undergoing  withdrawal Patient is willing to stop abusing substances. Continue Suboxone.  Continue clonidine.  Add clonazepam.  Continue Ativan as needed. Continue BuSpar for anxiety.  Acute on chronic diastolic CHF. In the setting of severe MR and AI. Management per cardiology.  Currently receiving IV diuresis.  Right foot cellulitis.  No evidence of osteomyelitis/polymyositis/abscess/fasciitis. MRI resulted in 11/2.  Reassuring for no evidence of osteomyelitis. Orthopedic actually has been following in the hospital and I also earlier discussed with Dr. Lajoyce Corners on phone 11/1.   Based on cardiology and CT surgery recommendation currently further management of wound is conservative only.  Suspect will require local debridement once stable from cardiac perspective. Will monitor progression.  Attempt for substance use in the hospital. Patient was caught attempting to inject substance in the hospital on 10/31. Security was called in, multiple syringes and needles were identified in his personal belongings as well as different substance. Visitation restricted. Telemonitoring initiated.  Hyponatremia. Metabolic acidosis. Etiology not clear for now we will monitor.  Elevated LFT. AST less than ALT. Bilirubin normal. Again etiology not clear. Will monitor. Further workup pending trend.  Anemia of chronic disease. Hemoglobin dropped down to 6.9. Requiring blood transfusion. Fibrinogen normal.  INR 1.5.  Reticulocyte count elevated. Transfuse for hemoglobin less than 8. Posttransfusion H&H is stable.    Thrombocytopenia. Improving. For now monitor.   Moderate protein calorie malnutrition  Nutrition Problem: Moderate Malnutrition Etiology: social / environmental circumstances Nutrition Interventions: Interventions: Refer to RD note for recommendations Body mass index is 20.51 kg/m.   Goals of care conversation. Multiple discussion with the patient with regards to his goals of care. Patient  appears to have severe aortic regurgitation as well as mitral regurgitation. Appears to be developing pulmonary edema. Appears to be at risk for cardiogenic shock. With ongoing withdrawal  and ongoing substance abuse. Per CT surgery " per Dr. Leafy Ro, consulting CT surgeon: Ongoing heroin use and a chronic wound that would need to be dealt with way before he would ever be considered a surgical candidate and also would need to be off heroine" " Per cardiology recommended palliative care and DNR. I encouraged with the patient and mother on the phone. At present given his hemodynamic situation attempting CPR or intubation would not result in a good outcome for his quality of life. Patient currently understands this and agrees to transition to DNR DNI.  Mother also understands that. Will continue to diurese and supportive care. Cardiology will continue to follow to manage heart failure.  Coagulopathy. INR elevated 1.5 Will treat with vitamin K and recheck.  Abdominal pain. Patient reports severe abdominal pain. Etiology is not clear. Given the rigidity on the exam will perform CT abdomen.  Bilateral pleural effusion. Perform CT chest.  Continue with diuresis.   Subjective: No nausea no vomiting no fever no chills.  Continues to have shortness of breath.  Has anxiety.  Heart rate improving.  Physical Exam: General: in moderate distress, No Rash Cardiovascular: S1 and S2 Present, aortic murmur Respiratory: Good respiratory effort, Bilateral Air entry present.  Basal crackles, No wheezes Abdomen: Bowel Sound present, diffuse abdominal tenderness Extremities: Bilateral edema Neuro: Alert and oriented x3, no new focal deficit  Data Reviewed: I have Reviewed nursing notes, Vitals, and Lab results. Since last encounter, pertinent lab results CBC and BMP   . I have ordered test including CBC and BMP  .   Disposition: Status is: Inpatient Remains inpatient appropriate because: Improvement  in infection  enoxaparin (LOVENOX) injection 40 mg Start: 11/03/22 1000   Family Communication: No one at bedside.  Discussed with mother on 11/1. Level of care: Progressive   Vitals:   11/03/22 0423 11/03/22 0724 11/03/22 1222 11/03/22 1554  BP: 128/73 120/66 120/68 128/61  Pulse: (!) 116 (!) 110 (!) 111 (!) 110  Resp: (!) 26 (!) 21 (!) 22 (!) 22  Temp: 97.8 F (36.6 C) 97.8 F (36.6 C) 97.7 F (36.5 C) 98.2 F (36.8 C)  TempSrc: Oral Oral Oral Oral  SpO2: 98% 100% 99% 98%  Weight:      Height:         Author: Lynden Oxford, MD 11/03/2022 5:46 PM  Please look on [email or web address] to find out who is on call.

## 2022-11-03 NOTE — Progress Notes (Signed)
Rounding Note    Patient Name: Rick Taylor Date of Encounter: 11/03/2022  [facility] Cardiologist: new  Subjective   SOB mildly improved  Inpatient Medications    Scheduled Meds:  ascorbic acid  500 mg Oral BID   buprenorphine-naloxone  1 tablet Sublingual BID   busPIRone  10 mg Oral BID   Chlorhexidine Gluconate Cloth  6 each Topical Q0600   clonazePAM  1 mg Oral BID   cloNIDine  0.1 mg Oral Daily   collagenase   Topical Daily   vitamin B-12  1,000 mcg Oral Daily   enoxaparin (LOVENOX) injection  40 mg Subcutaneous Q24H   feeding supplement  237 mL Oral TID BM   ferrous sulfate  325 mg Oral Q breakfast   furosemide  20 mg Intravenous BID   methocarbamol  500 mg Oral TID   multivitamin with minerals  1 tablet Oral Daily   mupirocin ointment  1 Application Nasal BID   nicotine  21 mg Transdermal Q24H   zinc sulfate  220 mg Oral Daily   Continuous Infusions:  ampicillin (OMNIPEN) IV 2 g (11/03/22 0408)   cefTRIAXone (ROCEPHIN)  IV 2 g (11/02/22 2232)   PRN Meds: acetaminophen **OR** acetaminophen, dicyclomine, hydrOXYzine, ipratropium-albuterol, loperamide, LORazepam, ondansetron **OR** ondansetron (ZOFRAN) IV, oxyCODONE **OR** oxyCODONE   Vital Signs    Vitals:   11/02/22 1712 11/02/22 2032 11/03/22 0423 11/03/22 0724  BP: (!) 154/84 116/62 128/73 120/66  Pulse: (!) 109 (!) 109 (!) 116 (!) 110  Resp:  (!) 22 (!) 26 (!) 21  Temp: 97.8 F (36.6 C) 97.6 F (36.4 C) 97.8 F (36.6 C) 97.8 F (36.6 C)  TempSrc: Oral Axillary Oral Oral  SpO2: 100% 99% 98% 100%  Weight:      Height:        Intake/Output Summary (Last 24 hours) at 11/03/2022 0741 Last data filed at 11/03/2022 0700 Gross per 24 hour  Intake 676.67 ml  Output 900 ml  Net -223.33 ml      10/31/2022   11:49 AM 10/31/2022    4:43 AM  Last 3 Weights  Weight (lbs) 155 lb 6.8 oz 150 lb  Weight (kg) 70.5 kg 68.04 kg      Telemetry    Sinus tach - Personally Reviewed  ECG     N/a - Personally Reviewed  Physical Exam   GEN: No acute distress.   Neck: No JVD Cardiac: tachy, coarse breath sounds muffle heart sounds Respiratory: coarse bilaterally GI: Soft, nontender, non-distended  MS: 1+ bilateral LE edema Neuro:  Nonfocal  Psych: Normal affect   Labs    High Sensitivity Troponin:  No results for input(s): "TROPONINIHS" in the last 720 hours.   Chemistry Recent Labs  Lab 10/31/22 1355 11/01/22 0507 11/02/22 0546  NA 128* 129* 134*  K 4.4 4.0 3.4*  CL 99 103 103  CO2 21* 19* 20*  GLUCOSE 108* 118* 122*  BUN 30* 25* 20  CREATININE 0.69 0.50* 0.60*  CALCIUM 7.5* 7.4* 7.8*  MG 2.4  --   --   PROT 6.1* 5.8* 6.1*  ALBUMIN 2.1* 2.0* 2.1*  AST 70* 48* 38  ALT 130* 102* 85*  ALKPHOS 143* 122 124  BILITOT 1.0 0.8 1.2  GFRNONAA >60 >60 >60  ANIONGAP 8 7 11     Lipids No results for input(s): "CHOL", "TRIG", "HDL", "LABVLDL", "LDLCALC", "CHOLHDL" in the last 168 hours.  Hematology Recent Labs  Lab 11/01/22 0507 11/01/22 1512 11/01/22 1516 11/02/22  0546  WBC 17.6* 19.0*  --  22.7*  RBC 2.83* 3.66* 3.71* 3.62*  HGB 6.8* 8.8*  --  8.8*  HCT 22.5* 28.8*  --  28.0*  MCV 79.5* 78.7*  --  77.3*  MCH 24.0* 24.0*  --  24.3*  MCHC 30.2 30.6  --  31.4  RDW 19.9* 19.5*  --  20.3*  PLT 165 200  --  189   Thyroid No results for input(s): "TSH", "FREET4" in the last 168 hours.  BNPNo results for input(s): "BNP", "PROBNP" in the last 168 hours.  DDimer No results for input(s): "DDIMER" in the last 168 hours.   Radiology    ECHOCARDIOGRAM COMPLETE  Result Date: 11/02/2022    ECHOCARDIOGRAM REPORT   Patient Name:   Rick Taylor Date of Exam: 11/02/2022 Medical Rec #:  [id number]    Height:       73.0 in Accession #:    [id number]   Weight:       155.4 lb Date of Birth:  March 14, 1990     BSA:          1.933 m Patient Age:    32 years     BP:           123/75 mmHg Patient Gender: M            HR:           100 bpm. Exam Location:  Inpatient Procedure: 2D Echo,  Cardiac Doppler and Color Doppler REPORT CONTAINS CRITICAL RESULT Indications:    Bacteremia  History:        Patient has no prior history of Echocardiogram examinations.                 Polysubstance abuse, IVDU.  Sonographer:    Milda Smart Referring Phys: [id number] PRANAV M PATEL  Sonographer Comments: Image acquisition challenging due to respiratory motion and Image acquisition challenging due to uncooperative patient. IMPRESSIONS  1. Left ventricular ejection fraction, by estimation, is 55%. The left ventricle has no regional wall motion abnormalities. The left ventricular internal cavity size was severely dilated. Left ventricular diastolic parameters are indeterminate. Elevated  left ventricular end-diastolic pressure.  2. Right ventricular systolic function moderate to severely reduced. The right ventricular size is mildly enlarged.  3. Left atrial size was mild to moderately dilated.  4. Right atrial size was mildly dilated.  5. A small pericardial effusion is present. Large pleural effusion.  6. Moderate to severe mitral valve regurgitation.  7. AV is thickened, leaflets appear shaggy There appears to be a vegetation on left coronary cusp with destruction of the normal leaflet function. Leaflets do not coapt. Deceleration time is very short at 96 msec. There is holosystolic backflow from the  descending aorta. Overall consistent with torrential AI.Marland Kitchen The aortic valve is tricuspid. Aortic valve regurgitation is severe. Aortic regurgitation PHT measures 96 msec.  8. The inferior vena cava is dilated in size with >50% respiratory variability, suggesting right atrial pressure of 8 mmHg. FINDINGS  Left Ventricle: Left ventricular ejection fraction, by estimation, is 55%. The left ventricle has no regional wall motion abnormalities. The left ventricular internal cavity size was severely dilated. There is no left ventricular hypertrophy. Left ventricular diastolic parameters are indeterminate. Elevated left  ventricular end-diastolic pressure. Right Ventricle: The right ventricular size is mildly enlarged. Right vetricular wall thickness was not assessed. Right ventricular systolic function moderate to severely reduced. Left Atrium: Left atrial size was mild to moderately dilated. Right Atrium:  Right atrial size was mildly dilated. Pericardium: A small pericardial effusion is present. Mitral Valve: There is mild thickening of the mitral valve leaflet(s). Moderate to severe mitral valve regurgitation. Tricuspid Valve: The tricuspid valve is normal in structure. Tricuspid valve regurgitation is mild. Aortic Valve: AV is thickened, leaflets appear shaggy There appears to be a vegetation on left coronary cusp with destruction of the normal leaflet function. Leaflets do not coapt. Deceleration time is very short at 96 msec. There is holosystolic backflow from the descending aorta. Overall consistent with torrential AI. The aortic valve is tricuspid. Aortic valve regurgitation is severe. Aortic regurgitation PHT measures 96 msec. Pulmonic Valve: The pulmonic valve was grossly normal. Pulmonic valve regurgitation is mild. Aorta: The aortic root and ascending aorta are structurally normal, with no evidence of dilitation. Venous: The inferior vena cava is dilated in size with greater than 50% respiratory variability, suggesting right atrial pressure of 8 mmHg. IAS/Shunts: No atrial level shunt detected by color flow Doppler. Additional Comments: There is a large pleural effusion.  LEFT VENTRICLE PLAX 2D LVIDd:         6.20 cm LVIDs:         3.80 cm LV PW:         1.00 cm LV IVS:        0.70 cm LVOT diam:     2.40 cm LVOT Area:     4.52 cm  IVC IVC diam: 2.90 cm LEFT ATRIUM             Index        RIGHT ATRIUM           Index LA diam:        5.00 cm 2.59 cm/m   RA Area:     16.90 cm LA Vol (A2C):   64.8 ml 33.52 ml/m  RA Volume:   51.10 ml  26.44 ml/m LA Vol (A4C):   72.6 ml 37.56 ml/m LA Biplane Vol: 70.3 ml 36.37 ml/m   AORTIC VALVE AI PHT:      96 msec  AORTA Ao Root diam: 3.00 cm Ao Asc diam:  3.20 cm MR Peak grad: 88.4 mmHg   TRICUSPID VALVE MR Vmax:      470.00 cm/s TR Peak grad:   17.5 mmHg                           TR Vmax:        209.00 cm/s                            SHUNTS                           Systemic Diam: 2.40 cm Dietrich Pates MD Electronically signed by Dietrich Pates MD Signature Date/Time: 11/02/2022/9:34:49 AM    Final    DG Chest Port 1 View  Result Date: 11/02/2022 CLINICAL DATA:  [id number] shortness of breath.  History of drug use. EXAM: PORTABLE CHEST 1 VIEW COMPARISON:  PA Lat 10/02/2014 FINDINGS: Interval slight enlargement of the cardiac silhouette. The central vessels are probably normal in caliber but they are not well seen due to central airspace disease. There is mild-to-moderate interstitial edema with a basal gradient and at least small-sized underlying layering pleural effusions. There are perihilar and bibasilar patchy opacities consistent with alveolar edema, pneumonia or combination. The upper  1/3 of the lungs remain clear. The mediastinum is normally outlined. Thoracic cage is intact.  There is overlying monitor wiring. IMPRESSION: 1. Interval slight enlargement of the cardiac silhouette. 2. Mild-to-moderate interstitial edema with a basal gradient and at least small-sized underlying layering pleural effusions. 3. Perihilar and bibasilar patchy opacities consistent with alveolar edema/ARDS, pneumonia or combination. Electronically Signed   By: Almira Bar M.D.   On: 11/02/2022 01:31    Cardiac Studies     Patient Profile     Rick Taylor is a 32 y.o. male with a hx of tobacco abuse, daily polysubstance abuse (IV heroin injection, crack cocaine, fentanyl) and chronic right lower extremity wound who is being seen 11/02/2022 for the evaluation of severe aortic valve regurgitation and endocarditis at the request of Dr. Allena Katz.   Assessment & Plan    1.Endocarditis with severe AI, moderate  to severe MR - 11/2022 echo: LVEF 55%, indet diastolic, mod to severe RV dysfunction, mod to severe MR, probable AV vegetation with severe AI - blood cultures + enterococcus faecalis, followed by ID - history of IV drug use - awaiting TEE, postponed due to SOB - from notes deemend not a surgical candidate by CT surgery due to ongoing substance abuse, chronic leg wound, poor functional capacity.  - patient made DNR/DNI, continue to treat medically with antibiotics. Palliative care consulted.    2. Acute on chronic HFpEF/RV failure/Severe AI - signs of HF and volume overload, SOB - BNP pending, CXR mild to mod edema, perihilar and bibasilar patchy opacities consistent with alveolar edema/ARDS, pneumonia or combination - started on IV lasix 20mg  bid. Negative yesterday, I/Os incomplete for admissoin. Labs pending this morning  - remains fluid overloaded, increase IV lasix to 40mg  bid.    3. Chronic right lower extremity wound      For questions or updates, please contact [facility] Please consult [email or web address] for contact info under        Signed, Dina Rich, MD  11/03/2022, 7:41 AM

## 2022-11-04 ENCOUNTER — Inpatient Hospital Stay (HOSPITAL_COMMUNITY): Payer: Self-pay

## 2022-11-04 DIAGNOSIS — I330 Acute and subacute infective endocarditis: Secondary | ICD-10-CM

## 2022-11-04 LAB — COMPREHENSIVE METABOLIC PANEL
ALT: 63 U/L — ABNORMAL HIGH (ref 0–44)
AST: 31 U/L (ref 15–41)
Albumin: 2.4 g/dL — ABNORMAL LOW (ref 3.5–5.0)
Alkaline Phosphatase: 122 U/L (ref 38–126)
Anion gap: 9 (ref 5–15)
BUN: 25 mg/dL — ABNORMAL HIGH (ref 6–20)
CO2: 23 mmol/L (ref 22–32)
Calcium: 7.7 mg/dL — ABNORMAL LOW (ref 8.9–10.3)
Chloride: 100 mmol/L (ref 98–111)
Creatinine, Ser: 0.83 mg/dL (ref 0.61–1.24)
GFR, Estimated: >60 mL/min (ref >60)
Glucose, Bld: 123 mg/dL — ABNORMAL HIGH (ref 70–99)
Potassium: 4 mmol/L (ref 3.5–5.1)
Sodium: 132 mmol/L — ABNORMAL LOW (ref 135–145)
Total Bilirubin: 0.5 mg/dL (ref 0.3–1.2)
Total Protein: 7.5 g/dL (ref 6.5–8.1)

## 2022-11-04 LAB — CBC WITH DIFFERENTIAL/PLATELET
Abs Immature Granulocytes: 0.3 10*3/uL — ABNORMAL HIGH (ref 0.00–0.07)
Basophils Absolute: 0 10*3/uL (ref 0.0–0.1)
Basophils Relative: 0 %
Eosinophils Absolute: 0.1 10*3/uL (ref 0.0–0.5)
Eosinophils Relative: 0 %
HCT: 33.4 % — ABNORMAL LOW (ref 39.0–52.0)
Hemoglobin: 9.9 g/dL — ABNORMAL LOW (ref 13.0–17.0)
Immature Granulocytes: 2 %
Lymphocytes Relative: 16 %
Lymphs Abs: 2.6 10*3/uL (ref 0.7–4.0)
MCH: 24.2 pg — ABNORMAL LOW (ref 26.0–34.0)
MCHC: 29.6 g/dL — ABNORMAL LOW (ref 30.0–36.0)
MCV: 81.7 fL (ref 80.0–100.0)
Monocytes Absolute: 0.8 10*3/uL (ref 0.1–1.0)
Monocytes Relative: 5 %
Neutro Abs: 12.8 10*3/uL — ABNORMAL HIGH (ref 1.7–7.7)
Neutrophils Relative %: 77 %
Platelets: 323 10*3/uL (ref 150–400)
RBC: 4.09 MIL/uL — ABNORMAL LOW (ref 4.22–5.81)
RDW: 22.1 % — ABNORMAL HIGH (ref 11.5–15.5)
WBC: 16.6 10*3/uL — ABNORMAL HIGH (ref 4.0–10.5)
nRBC: 0.1 % (ref 0.0–0.2)

## 2022-11-04 LAB — PROTIME-INR
INR: 1.3 — ABNORMAL HIGH (ref 0.8–1.2)
Prothrombin Time: 16.6 s — ABNORMAL HIGH (ref 11.4–15.2)

## 2022-11-04 LAB — MAGNESIUM: Magnesium: 2.3 mg/dL (ref 1.7–2.4)

## 2022-11-04 MED ORDER — POLYETHYLENE GLYCOL 3350 17 G PO PACK
17.0000 g | PACK | Freq: Every day | ORAL | Status: DC
  Administered 2022-11-05: 17 g via ORAL
  Filled 2022-11-04 (×2): qty 1

## 2022-11-04 MED ORDER — FUROSEMIDE 10 MG/ML IJ SOLN
60.0000 mg | Freq: Two times a day (BID) | INTRAMUSCULAR | Status: DC
  Administered 2022-11-04 – 2022-11-06 (×5): 60 mg via INTRAVENOUS
  Filled 2022-11-04 (×5): qty 6

## 2022-11-04 MED ORDER — FLUCONAZOLE 100 MG PO TABS
100.0000 mg | ORAL_TABLET | Freq: Every day | ORAL | Status: AC
  Administered 2022-11-04 – 2022-11-13 (×10): 100 mg via ORAL
  Filled 2022-11-04 (×10): qty 1

## 2022-11-04 MED ORDER — GUAIFENESIN 100 MG/5ML PO LIQD
5.0000 mL | ORAL | Status: DC | PRN
  Filled 2022-11-04: qty 10

## 2022-11-04 MED ORDER — DOCUSATE SODIUM 100 MG PO CAPS
100.0000 mg | ORAL_CAPSULE | Freq: Two times a day (BID) | ORAL | Status: DC
  Administered 2022-11-04 – 2022-11-05 (×3): 100 mg via ORAL
  Filled 2022-11-04 (×4): qty 1

## 2022-11-04 MED ORDER — MAGIC MOUTHWASH
5.0000 mL | Freq: Four times a day (QID) | ORAL | Status: AC
  Administered 2022-11-04 – 2022-11-14 (×13): 5 mL via ORAL
  Filled 2022-11-04 (×40): qty 5

## 2022-11-04 MED ORDER — SENNOSIDES-DOCUSATE SODIUM 8.6-50 MG PO TABS
1.0000 | ORAL_TABLET | Freq: Two times a day (BID) | ORAL | Status: DC
  Administered 2022-11-04: 1 via ORAL
  Filled 2022-11-04: qty 1

## 2022-11-04 NOTE — Progress Notes (Signed)
[facility] Progress Note Patient: Rick Taylor UEA:[id number] DOB: 04/07/1990 DOA: 10/30/2022  DOS: the patient was seen and examined on 11/04/2022  Brief hospital course: PMH of polysubstance abuse presented to the hospital with complaints of right leg pain. Currently being treated for Enterococcus bacteremia as well as right leg cellulitis. 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 Cardiology and cardiothoracic surgery (Dr. Leafy Ro) felt that the patient is not a surgical candidate.  Current plan is supportive care with IV antibiotics.  Assessment and Plan: Right leg cellulitis. E faecalis bacteremia. 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 Presents with complaints of right leg pain. X-ray and MRI negative for osteomyelitis or deep abscess but does show possibility of a cellulitis. ABI evaluation shows adequate circulation. Orthopedic recommended local wound care for now. Blood culture came back positive for E faecalis. Appreciate ID consultation. TTE shows evidence of preserved EF but torrential AI and severe MR with LV dilation. Cardiology was formally consulted. Initial plan was to transfer to [facility] for cardiothoracic surgery evaluation. Dr. Leafy Ro reviewed the chart and recommended that the patient is not a surgical candidate for now due to ongoing heroin use. Cardiology recommended to consider palliative care consultation and DNR. See the abdomen performed showed evidence of splenic and renal infarct as well.  Polysubstance abuse. 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 He is UDS is positive for amphetamine, opiates, cocaine, cannabis.  (UDS performed after receiving opiates in the hospital) Reports 3-4 times fentanyl use at home. At present patient is critically ill.  Most likely will require prolonged IV antibiotic in the hospital.  May require surgical debridement  of for his wound as well. Has a heavy use of opioid use and currently appears to be undergoing withdrawal Patient is willing to stop abusing substances. Continue Suboxone.  Continue clonidine.  Add clonazepam.  Continue Ativan as needed. Continue BuSpar for anxiety.  Acute on chronic diastolic CHF. In the setting of severe MR and AI. Management per cardiology.  Currently receiving IV diuresis.  Right foot cellulitis.  No evidence of osteomyelitis/polymyositis/abscess/fasciitis. MRI resulted in 11/2.  Reassuring for no evidence of osteomyelitis. Orthopedic actually has been following in the hospital and I also earlier discussed with Dr. Lajoyce Corners on phone 11/1.   Based on cardiology and CT surgery recommendation currently further management of wound is conservative only.  Suspect will require local debridement once stable from cardiac perspective. Will monitor progression.  Attempt for substance use in the hospital. Patient was caught attempting to inject substance in the hospital on 10/31. Security was called in, multiple syringes and needles were identified in his personal belongings as well as different substance. Visitation restricted. Telemonitoring initiated.  Hyponatremia. Metabolic acidosis. Etiology not clear for now we will monitor.  Elevated LFT. AST less than ALT. Bilirubin normal. Again etiology not clear. Will monitor. Further workup pending trend.  Anemia of chronic disease. Hemoglobin dropped down to 6.9. Requiring blood transfusion. Fibrinogen normal.  INR 1.5.  Reticulocyte count elevated. Transfuse for hemoglobin less than 8. Posttransfusion H&H is stable.    Thrombocytopenia. Improving. For now monitor.   Moderate protein calorie malnutrition  Nutrition Problem: Moderate Malnutrition Etiology: social / environmental circumstances Nutrition Interventions: Interventions: Refer to RD note for recommendations Body mass index is 20.51 kg/m.   Goals of care  conversation. Multiple discussion with the patient with regards to his goals of care. Patient appears to have severe aortic regurgitation as well as mitral  regurgitation. Appears to be developing pulmonary edema. Appears to be at risk for cardiogenic shock. With ongoing withdrawal and ongoing substance abuse. Per CT surgery " per Dr. Leafy Ro, consulting CT surgeon: Ongoing heroin use and a chronic wound that would need to be dealt with way before he would ever be considered a surgical candidate and also would need to be off heroine" " Per cardiology recommended palliative care and DNR. I encouraged with the patient and mother on the phone. At present given his hemodynamic situation attempting CPR or intubation would not result in a good outcome for his quality of life. Patient currently understands this and agrees to transition to DNR DNI.  Mother also understands that. Will continue to diurese and supportive care. Cardiology will continue to follow to manage heart failure.  Coagulopathy. INR elevated 1.5 Will treat with vitamin K and recheck.  Constipation Severe abdominal pain a CT abdomen was performed.  No acute abnormality seen but other than some constipation. Continue bowel regimen.  Bilateral pleural effusion. Perform CT chest.  Continue with diuresis.   Subjective: No nausea no vomiting no fever no chills.  Had a BM earlier today.  Breathing still heavy.  Physical Exam: General: in moderate distress, No Rash Cardiovascular: S1 and S2 Present, No Murmur Respiratory: Good respiratory effort, Bilateral Air entry present. No Crackles, No wheezes Abdomen: Bowel Sound present, No tenderness Extremities: Bilateral edema Neuro: Alert and oriented x3, no new focal deficit  Data Reviewed: I have Reviewed nursing notes, Vitals, and Lab results. Since last encounter, pertinent lab results CBC and BMP   . I have ordered test including CBC and BMP  .   Disposition: Status is:  Inpatient Remains inpatient appropriate because: Need for IV diuresis and will require further workup as well as prolonged IV antibiotic.  enoxaparin (LOVENOX) injection 40 mg Start: 11/03/22 1000   Family Communication: No one at bedside Level of care: Progressive continue due to critical illness Vitals:   11/03/22 1950 11/04/22 0409 11/04/22 0500 11/04/22 0956  BP: 118/60 126/65  (!) 120/59  Pulse: (!) 108 (!) 112  (!) 110  Resp: 15 16  (!) 36  Temp: 98.9 F (37.2 C)  98.2 F (36.8 C) 97.8 F (36.6 C)  TempSrc: Oral  Oral Oral  SpO2: 94% 99%  96%  Weight:      Height:         Author: Lynden Oxford, MD 11/04/2022 2:14 PM  Please look on [email or web address] to find out who is on call.

## 2022-11-04 NOTE — Plan of Care (Signed)

## 2022-11-04 NOTE — Plan of Care (Signed)
  Problem: Education: Goal: Knowledge of General Education information will improve Description: Including pain rating scale, medication(s)/side effects and non-pharmacologic comfort measures 11/04/2022 0206 by Charmian Muff, RN Outcome: Progressing 11/04/2022 0205 by Charmian Muff, RN Outcome: Progressing   Problem: Health Behavior/Discharge Planning: Goal: Ability to manage health-related needs will improve 11/04/2022 0206 by Charmian Muff, RN Outcome: Progressing 11/04/2022 0205 by Charmian Muff, RN Outcome: Progressing   Problem: Clinical Measurements: Goal: Ability to maintain clinical measurements within normal limits will improve 11/04/2022 0206 by Charmian Muff, RN Outcome: Progressing 11/04/2022 0205 by Charmian Muff, RN Outcome: Progressing

## 2022-11-04 NOTE — Progress Notes (Signed)
Pt refused all all draws try to educate and explain to pt importance of maintaining daily las. Pt continued to refused.No lbs for this am. SRP, RN

## 2022-11-04 NOTE — Progress Notes (Addendum)
Rounding Note    Patient Name: Rick Taylor Date of Encounter: 11/04/2022  [facility] Cardiologist: New  Subjective   Ongoing SOB  Inpatient Medications    Scheduled Meds:  ascorbic acid  500 mg Oral BID   buprenorphine-naloxone  1 tablet Sublingual BID   busPIRone  10 mg Oral BID   Chlorhexidine Gluconate Cloth  6 each Topical Q0600   clonazePAM  1 mg Oral BID   cloNIDine  0.1 mg Oral Daily   collagenase   Topical Daily   vitamin B-12  1,000 mcg Oral Daily   enoxaparin (LOVENOX) injection  40 mg Subcutaneous Q24H   feeding supplement  237 mL Oral TID BM   ferrous sulfate  325 mg Oral Q breakfast   furosemide  40 mg Intravenous BID   methocarbamol  500 mg Oral TID   multivitamin with minerals  1 tablet Oral Daily   mupirocin ointment  1 Application Nasal BID   nicotine  21 mg Transdermal Q24H   zinc sulfate  220 mg Oral Daily   Continuous Infusions:  ampicillin (OMNIPEN) IV 2 g (11/04/22 0130)   cefTRIAXone (ROCEPHIN)  IV 2 g (11/03/22 2244)   PRN Meds: acetaminophen **OR** acetaminophen, dicyclomine, hydrOXYzine, ipratropium-albuterol, loperamide, LORazepam, ondansetron **OR** ondansetron (ZOFRAN) IV, oxyCODONE **OR** oxyCODONE   Vital Signs    Vitals:   11/03/22 1554 11/03/22 1950 11/04/22 0409 11/04/22 0500  BP: 128/61 118/60 126/65   Pulse: (!) 110 (!) 108 (!) 112   Resp: (!) 22 15 16    Temp: 98.2 F (36.8 C) 98.9 F (37.2 C)  98.2 F (36.8 C)  TempSrc: Oral Oral  Oral  SpO2: 98% 94% 99%   Weight:      Height:        Intake/Output Summary (Last 24 hours) at 11/04/2022 0700 Last data filed at 11/04/2022 8657 Gross per 24 hour  Intake 240 ml  Output 3050 ml  Net -2810 ml      10/31/2022   11:49 AM 10/31/2022    4:43 AM  Last 3 Weights  Weight (lbs) 155 lb 6.8 oz 150 lb  Weight (kg) 70.5 kg 68.04 kg      Telemetry    Sinus tach - Personally Reviewed  ECG    N/a - Personally Reviewed  Physical Exam   GEN: No acute  distress.   Neck: + JVD Cardiac: Regular, tachy, 2/6 diastolic murmur rusb Respiratory:crackles bilaterlaly GI: Soft, nontender, non-distended  MS: No edema; No deformity. Neuro:  Nonfocal  Psych: Normal affect   Labs    High Sensitivity Troponin:  No results for input(s): "TROPONINIHS" in the last 720 hours.   Chemistry Recent Labs  Lab 10/31/22 1355 11/01/22 0507 11/02/22 0546 11/03/22 0927  NA 128* 129* 134* 132*  K 4.4 4.0 3.4* 3.9  CL 99 103 103 101  CO2 21* 19* 20* 20*  GLUCOSE 108* 118* 122* 119*  BUN 30* 25* 20 23*  CREATININE 0.69 0.50* 0.60* 0.66  CALCIUM 7.5* 7.4* 7.8* 7.7*  MG 2.4  --   --  2.0  PROT 6.1* 5.8* 6.1*  --   ALBUMIN 2.1* 2.0* 2.1*  --   AST 70* 48* 38  --   ALT 130* 102* 85*  --   ALKPHOS 143* 122 124  --   BILITOT 1.0 0.8 1.2  --   GFRNONAA >60 >60 >60 >60  ANIONGAP 8 7 11 11     Lipids No results for input(s): "CHOL", "TRIG", "HDL", "  LABVLDL", "LDLCALC", "CHOLHDL" in the last 168 hours.  Hematology Recent Labs  Lab 11/02/22 0546 11/03/22 0927 11/04/22 0632  WBC 22.7* 20.7* 16.6*  RBC 3.62* 3.49* 4.09*  HGB 8.8* 8.5* 9.9*  HCT 28.0* 28.1* 33.4*  MCV 77.3* 80.5 81.7  MCH 24.3* 24.4* 24.2*  MCHC 31.4 30.2 29.6*  RDW 20.3* 21.4* 22.1*  PLT 189 227 323   Thyroid No results for input(s): "TSH", "FREET4" in the last 168 hours.  BNP Recent Labs  Lab 11/03/22 0927  BNP >4,500.0*    DDimer No results for input(s): "DDIMER" in the last 168 hours.   Radiology    CT CHEST ABDOMEN PELVIS W CONTRAST  Result Date: 11/03/2022 CLINICAL DATA:  Sepsis EXAM: CT CHEST, ABDOMEN, AND PELVIS WITH CONTRAST TECHNIQUE: Multidetector CT imaging of the chest, abdomen and pelvis was performed following the standard protocol during bolus administration of intravenous contrast. RADIATION DOSE REDUCTION: This exam was performed according to the departmental dose-optimization program which includes automated exposure control, adjustment of the mA and/or kV  according to patient size and/or use of iterative reconstruction technique. CONTRAST:  OMNIPAQUE IOHEXOL 300 MG/ML  SOLN COMPARISON:  None Available. FINDINGS: CT CHEST FINDINGS Cardiovascular: Heart is normal size.  Aorta normal caliber. Mediastinum/Nodes: No mediastinal, hilar, or axillary adenopathy. Trachea and esophagus are unremarkable. Thyroid unremarkable. Lungs/Pleura: Large bilateral pleural effusions. Compressive atelectasis in the lower lobes bilaterally. Ground-glass airspace opacities throughout the upper lobes compatible with pneumonia. Musculoskeletal: Chest wall soft tissues are unremarkable. No acute bony abnormality. CT ABDOMEN PELVIS FINDINGS Hepatobiliary: Insert paddle biliary Pancreas: No focal abnormality or ductal dilatation. Spleen: Normal size. Low-density areas throughout the spleen, most notable along the inferior pole concerning for splenic infarcts. Adrenals/Urinary Tract: Wedge-shaped low-density areas in the lower pole of the right kidney concerning for infarcts. No stones or hydronephrosis. Adrenal glands and urinary bladder unremarkable. Stomach/Bowel: Stomach, large and small bowel grossly unremarkable. Vascular/Lymphatic: No evidence of aneurysm or adenopathy. Reproductive: No visible focal abnormality. Other: Diffuse edema throughout the subcutaneous soft tissues and mesentery. Musculoskeletal: No acute bony abnormalities. IMPRESSION: Large bilateral pleural effusions with compressive atelectasis in the lower lobes. Ground-glass opacities in the upper lobes concerning for pneumonia. Areas of low-density in the spleen most notable in the lower pole and in the lower pole of the right kidney most compatible with infarcts. Diffuse edema throughout the subcutaneous soft tissues and mesentery. Electronically Signed   By: Charlett Nose M.D.   On: 11/03/2022 19:22   ECHOCARDIOGRAM COMPLETE  Result Date: 11/02/2022    ECHOCARDIOGRAM REPORT   Patient Name:   Rick Taylor Date of  Exam: 11/02/2022 Medical Rec #:  [id number]    Height:       73.0 in Accession #:    [id number]   Weight:       155.4 lb Date of Birth:  11-07-1990     BSA:          1.933 m Patient Age:    32 years     BP:           123/75 mmHg Patient Gender: M            HR:           100 bpm. Exam Location:  Inpatient Procedure: 2D Echo, Cardiac Doppler and Color Doppler REPORT CONTAINS CRITICAL RESULT Indications:    Bacteremia  History:        Patient has no prior history of Echocardiogram examinations.  Polysubstance abuse, IVDU.  Sonographer:    Milda Smart Referring Phys: [id number] PRANAV M PATEL  Sonographer Comments: Image acquisition challenging due to respiratory motion and Image acquisition challenging due to uncooperative patient. IMPRESSIONS  1. Left ventricular ejection fraction, by estimation, is 55%. The left ventricle has no regional wall motion abnormalities. The left ventricular internal cavity size was severely dilated. Left ventricular diastolic parameters are indeterminate. Elevated  left ventricular end-diastolic pressure.  2. Right ventricular systolic function moderate to severely reduced. The right ventricular size is mildly enlarged.  3. Left atrial size was mild to moderately dilated.  4. Right atrial size was mildly dilated.  5. A small pericardial effusion is present. Large pleural effusion.  6. Moderate to severe mitral valve regurgitation.  7. AV is thickened, leaflets appear shaggy There appears to be a vegetation on left coronary cusp with destruction of the normal leaflet function. Leaflets do not coapt. Deceleration time is very short at 96 msec. There is holosystolic backflow from the  descending aorta. Overall consistent with torrential AI.Marland Kitchen The aortic valve is tricuspid. Aortic valve regurgitation is severe. Aortic regurgitation PHT measures 96 msec.  8. The inferior vena cava is dilated in size with >50% respiratory variability, suggesting right atrial pressure of 8 mmHg.  FINDINGS  Left Ventricle: Left ventricular ejection fraction, by estimation, is 55%. The left ventricle has no regional wall motion abnormalities. The left ventricular internal cavity size was severely dilated. There is no left ventricular hypertrophy. Left ventricular diastolic parameters are indeterminate. Elevated left ventricular end-diastolic pressure. Right Ventricle: The right ventricular size is mildly enlarged. Right vetricular wall thickness was not assessed. Right ventricular systolic function moderate to severely reduced. Left Atrium: Left atrial size was mild to moderately dilated. Right Atrium: Right atrial size was mildly dilated. Pericardium: A small pericardial effusion is present. Mitral Valve: There is mild thickening of the mitral valve leaflet(s). Moderate to severe mitral valve regurgitation. Tricuspid Valve: The tricuspid valve is normal in structure. Tricuspid valve regurgitation is mild. Aortic Valve: AV is thickened, leaflets appear shaggy There appears to be a vegetation on left coronary cusp with destruction of the normal leaflet function. Leaflets do not coapt. Deceleration time is very short at 96 msec. There is holosystolic backflow from the descending aorta. Overall consistent with torrential AI. The aortic valve is tricuspid. Aortic valve regurgitation is severe. Aortic regurgitation PHT measures 96 msec. Pulmonic Valve: The pulmonic valve was grossly normal. Pulmonic valve regurgitation is mild. Aorta: The aortic root and ascending aorta are structurally normal, with no evidence of dilitation. Venous: The inferior vena cava is dilated in size with greater than 50% respiratory variability, suggesting right atrial pressure of 8 mmHg. IAS/Shunts: No atrial level shunt detected by color flow Doppler. Additional Comments: There is a large pleural effusion.  LEFT VENTRICLE PLAX 2D LVIDd:         6.20 cm LVIDs:         3.80 cm LV PW:         1.00 cm LV IVS:        0.70 cm LVOT diam:      2.40 cm LVOT Area:     4.52 cm  IVC IVC diam: 2.90 cm LEFT ATRIUM             Index        RIGHT ATRIUM           Index LA diam:        5.00 cm 2.59 cm/m  RA Area:     16.90 cm LA Vol (A2C):   64.8 ml 33.52 ml/m  RA Volume:   51.10 ml  26.44 ml/m LA Vol (A4C):   72.6 ml 37.56 ml/m LA Biplane Vol: 70.3 ml 36.37 ml/m  AORTIC VALVE AI PHT:      96 msec  AORTA Ao Root diam: 3.00 cm Ao Asc diam:  3.20 cm MR Peak grad: 88.4 mmHg   TRICUSPID VALVE MR Vmax:      470.00 cm/s TR Peak grad:   17.5 mmHg                           TR Vmax:        209.00 cm/s                            SHUNTS                           Systemic Diam: 2.40 cm Dietrich Pates MD Electronically signed by Dietrich Pates MD Signature Date/Time: 11/02/2022/9:34:49 AM    Final     Cardiac Studies     Patient Profile     Sherod Cisse is a 32 y.o. male with a hx of tobacco abuse, daily polysubstance abuse (IV heroin injection, crack cocaine, fentanyl) and chronic right lower extremity wound who is being seen 11/02/2022 for the evaluation of severe aortic valve regurgitation and endocarditis at the request of Dr. Allena Katz.   Assessment & Plan    1.Endocarditis with severe AI, moderate to severe MR - 11/2022 echo: LVEF 55%, indet diastolic, mod to severe RV dysfunction, mod to severe MR, probable AV vegetation with severe AI - blood cultures + enterococcus faecalis, followed by ID - history of IV drug use - TEE cancelled due to severe SOB - from notes deemed not a surgical candidate by CT surgery due to ongoing substance abuse, chronic leg wound, poor functional capacity. Caught trying to inject home substances during admission.  -diurese to help manage symptoms - palliative care involvement, patient currently DNR  2. Acute on chronic HFpEF/RV failure/Severe AI - signs of HF and volume overload, SOB - BNP >4500, CXR mild to mod edema, perihilar and bibasilar patchy opacities consistent with alveolar edema/ARDS, pneumonia or combination  -  he is on IV lasix 40mg  bid. Negative 2.8 L yesterday, total I/Os are incomplete. Labs are pending this AM. Renal function is stable. REmains severely volume overloaded, increase IV lasix to 60mg  bid.       3. Chronic right lower extremity wound  4. Substance abuse - from primary team note patient with his own syringes in the hospital found trying to use while admitted.  - being managed for withdrawal by primary team  For questions or updates, please contact [facility] Please consult [email or web address] for contact info under        Signed, Dina Rich, MD  11/04/2022, 7:00 AM

## 2022-11-04 NOTE — Consult Note (Signed)
Consultation Note Date: 11/04/2022   Patient Name: Rick Taylor  DOB: 01-17-1990  MRN: [id number]  Age / Sex: 32 y.o., male  PCP: Patient, No Pcp Per Referring Physician: Rolly Salter, MD  Reason for Consultation: Establishing goals of care  HPI/Patient Profile: 32 y.o. male admitted on 10/30/2022  32 yo with PMH of polysubstance abuse presented to the hospital with complaints of right leg pain. Patient is admitted to hospital medicine service, currently being treated for Enterococcus bacteremia as well as right leg cellulitis. 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 Input was sought from cardiology and cardiothoracic surgery (Dr. Leafy Ro) f however, it was felt that the patient is not a surgical candidate.  Current plan is supportive care with IV antibiotics.   Clinical Assessment and Goals of Care: Patient remains admitted with right lower extremity cellulitis, a faecalis bacteremia, infective endocarditis with complication, splenic and renal infarcts on abdominal imaging.  He has serious illness of polysubstance use-uses fentanyl, urine drug screen positive for amphetamine cocaine and cannabis. Patient is on Suboxone, clonidine, clonazepam, Ativan as needed, BuSpar for anxiety as well. CODE STATUS has been modified to DNR with interventions Palliative consult for additional support Chart reviewed, palliative consult request received, patient seen and examined Palliative medicine is specialized medical care for people living with serious illness. It focuses on providing relief from the symptoms and stress of a serious illness. The goal is to improve quality of life for both the patient and the family. Goals of care: Broad aims of medical therapy in relation to the patient's values and preferences. Our aim is to provide medical care aimed at enabling  patients to achieve the goals that matter most to them, given the circumstances of their particular medical situation and their constraints.    NEXT OF KIN  Mother   SUMMARY OF RECOMMENDATIONS    Agree with DNR Monitor hospital course Recommend outpatient palliative support Recommend outpatient pain and addiction medicine follow up.  Guarded prognosis and patient remains at high risk for ongoing decline and decompensation - he is aware of the serious nature of his illness.   Code Status/Advance Care Planning: DNR   Symptom Management:      Palliative Prophylaxis:  Delirium Protocol   Psycho-social/Spiritual:  Desire for further Chaplaincy support:yes Additional Recommendations: Caregiving  Support/Resources  Prognosis:  Unable to determine  Discharge Planning: To Be Determined      Primary Diagnoses: Present on Admission:  Sepsis due to cellulitis (HCC)  Protein-calorie malnutrition, severe (HCC)  Hyponatremia  Abnormal LFTs  Microcytic anemia  Hyperglycemia  Tobacco abuse  Lower extremity injury  Cellulitis of right lower extremity  Acute opioid withdrawal (HCC)  Malnutrition of moderate degree   I have reviewed the medical record, interviewed the patient and family, and examined the patient. The following aspects are pertinent.  Past Medical History:  Diagnosis Date   Heart murmur    Leg wound, right    Polysubstance abuse (HCC)    IV  heroin, crack cocaine, fentanyl   Tobacco abuse    Social History   Socioeconomic History   Marital status: Single    Spouse name: Not on file   Number of children: Not on file   Years of education: Not on file   Highest education level: Not on file  Occupational History   Not on file  Tobacco Use   Smoking status: Every Day   Smokeless tobacco: Not on file  Substance and Sexual Activity   Alcohol use: Yes    Comment: occ   Drug use: Yes    Types: IV, "Crack" cocaine, Fentanyl, Heroin   Sexual activity: Not  on file  Other Topics Concern   Not on file  Social History Narrative   Not on file   Social Determinants of Health   Financial Resource Strain: Not on file  Food Insecurity: Food Insecurity Present (11/01/2022)   Hunger Vital Sign    Worried About Running Out of Food in the Last Year: Often true    Ran Out of Food in the Last Year: Often true  Transportation Needs: Unmet Transportation Needs (11/01/2022)   PRAPARE - Administrator, Civil Service (Medical): No    Lack of Transportation (Non-Medical): Yes  Physical Activity: Not on file  Stress: Not on file  Social Connections: Not on file   Family History  Problem Relation Age of Onset   Heart attack Neg Hx    Scheduled Meds:  ascorbic acid  500 mg Oral BID   buprenorphine-naloxone  1 tablet Sublingual BID   busPIRone  10 mg Oral BID   Chlorhexidine Gluconate Cloth  6 each Topical Q0600   clonazePAM  1 mg Oral BID   cloNIDine  0.1 mg Oral Daily   collagenase   Topical Daily   vitamin B-12  1,000 mcg Oral Daily   docusate sodium  100 mg Oral BID   enoxaparin (LOVENOX) injection  40 mg Subcutaneous Q24H   feeding supplement  237 mL Oral TID BM   ferrous sulfate  325 mg Oral Q breakfast   fluconazole  100 mg Oral Daily   furosemide  60 mg Intravenous BID   magic mouthwash  5 mL Oral QID   methocarbamol  500 mg Oral TID   multivitamin with minerals  1 tablet Oral Daily   mupirocin ointment  1 Application Nasal BID   nicotine  21 mg Transdermal Q24H   polyethylene glycol  17 g Oral Daily   zinc sulfate  220 mg Oral Daily   Continuous Infusions:  ampicillin (OMNIPEN) IV 2 g (11/04/22 1453)   cefTRIAXone (ROCEPHIN)  IV 2 g (11/04/22 1041)   PRN Meds:.acetaminophen **OR** acetaminophen, dicyclomine, hydrOXYzine, ipratropium-albuterol, loperamide, LORazepam, ondansetron **OR** ondansetron (ZOFRAN) IV, oxyCODONE **OR** oxyCODONE Medications Prior to Admission:  Prior to Admission medications   Medication Sig  Start Date End Date Taking? Authorizing Provider  cetirizine (ZYRTEC ALLERGY) 10 MG tablet Take 1 tablet (10 mg total) by mouth daily. Patient not taking: Reported on 10/31/2022 10/02/14   Emilia Beck, PA-C  ibuprofen (ADVIL,MOTRIN) 800 MG tablet Take 1 tablet (800 mg total) by mouth 3 (three) times daily. Patient not taking: Reported on 10/31/2022 10/02/14   Emilia Beck, PA-C  penicillin v potassium (VEETID) 500 MG tablet Take 1 tablet (500 mg total) by mouth 4 (four) times daily. Patient not taking: Reported on 10/31/2022 07/26/13   Devoria Albe, MD   No Known Allergies Review of Systems +generalized pain.  Physical Exam Awake alert Complains of generalized pain Normal work of breathing Has LE edema  Vital Signs: BP (!) 120/59 (BP Location: Left Arm)   Pulse (!) 110   Temp 97.8 F (36.6 C) (Oral)   Resp (!) 36   Ht 6\' 1"  (1.854 m)   Wt 70.5 kg   SpO2 96%   BMI 20.51 kg/m  Pain Scale: 0-10 POSS *See Group Information*: 1-Acceptable,Awake and alert Pain Score: 7    SpO2: SpO2: 96 % O2 Device:SpO2: 96 % O2 Flow Rate: .O2 Flow Rate (L/min): 2 L/min  IO: Intake/output summary:  Intake/Output Summary (Last 24 hours) at 11/04/2022 1455 Last data filed at 11/04/2022 1041 Gross per 24 hour  Intake 840 ml  Output 2450 ml  Net -1610 ml    LBM: Last BM Date : 11/04/22 Baseline Weight: Weight: 68 kg Most recent weight: Weight: 70.5 kg     Palliative Assessment/Data:   PPS 50%  Time In:  1400 Time Out:  1500 Time Total:  60  Greater than 50%  of this time was spent counseling and coordinating care related to the above assessment and plan.  Signed by: Rosalin Hawking, MD   Please contact Palliative Medicine Team phone at [phone] for questions and concerns.  For individual provider: See Loretha Stapler

## 2022-11-04 NOTE — Progress Notes (Signed)
IV team restarted IV and obtained am labs. SRP, RN

## 2022-11-04 NOTE — Progress Notes (Signed)
Rapid Response Event Note   Reason for Call :  Talked to Primary RN when rounding. RED MEWS 6   Initial Focused Assessment: Patient was calm at the time with a tele-sitter.  He had a low temperature of 94.8 with a pulse 112 of and respirations at 32.    Interventions: Robitussin given, warm blankets added and medications as well.    Plan of Care: MD made aware as well.   Event Summary: MEWS score now a 4.  MD Notified: 2156 Call Time: 2150 Arrival Time: 2150 End Time: Recheck 2324 - Will continue to monitor.  Peter Minium, RN

## 2022-11-05 ENCOUNTER — Other Ambulatory Visit (HOSPITAL_COMMUNITY): Payer: Self-pay

## 2022-11-05 DIAGNOSIS — I38 Endocarditis, valve unspecified: Secondary | ICD-10-CM

## 2022-11-05 DIAGNOSIS — R7881 Bacteremia: Secondary | ICD-10-CM

## 2022-11-05 LAB — CBC WITH DIFFERENTIAL/PLATELET
Abs Immature Granulocytes: 0.21 10*3/uL — ABNORMAL HIGH (ref 0.00–0.07)
Basophils Absolute: 0 10*3/uL (ref 0.0–0.1)
Basophils Relative: 0 %
Eosinophils Absolute: 0 10*3/uL (ref 0.0–0.5)
Eosinophils Relative: 0 %
HCT: 29 % — ABNORMAL LOW (ref 39.0–52.0)
Hemoglobin: 8.5 g/dL — ABNORMAL LOW (ref 13.0–17.0)
Immature Granulocytes: 1 %
Lymphocytes Relative: 12 %
Lymphs Abs: 2 10*3/uL (ref 0.7–4.0)
MCH: 23.9 pg — ABNORMAL LOW (ref 26.0–34.0)
MCHC: 29.3 g/dL — ABNORMAL LOW (ref 30.0–36.0)
MCV: 81.7 fL (ref 80.0–100.0)
Monocytes Absolute: 1.1 10*3/uL — ABNORMAL HIGH (ref 0.1–1.0)
Monocytes Relative: 6 %
Neutro Abs: 13.5 10*3/uL — ABNORMAL HIGH (ref 1.7–7.7)
Neutrophils Relative %: 81 %
Platelets: 271 10*3/uL (ref 150–400)
RBC: 3.55 MIL/uL — ABNORMAL LOW (ref 4.22–5.81)
RDW: 21.9 % — ABNORMAL HIGH (ref 11.5–15.5)
WBC: 16.8 10*3/uL — ABNORMAL HIGH (ref 4.0–10.5)
nRBC: 0 % (ref 0.0–0.2)

## 2022-11-05 LAB — MAGNESIUM: Magnesium: 2.4 mg/dL (ref 1.7–2.4)

## 2022-11-05 LAB — COMPREHENSIVE METABOLIC PANEL
ALT: 49 U/L — ABNORMAL HIGH (ref 0–44)
AST: 26 U/L (ref 15–41)
Albumin: 2.1 g/dL — ABNORMAL LOW (ref 3.5–5.0)
Alkaline Phosphatase: 103 U/L (ref 38–126)
Anion gap: 12 (ref 5–15)
BUN: 21 mg/dL — ABNORMAL HIGH (ref 6–20)
CO2: 22 mmol/L (ref 22–32)
Calcium: 7.9 mg/dL — ABNORMAL LOW (ref 8.9–10.3)
Chloride: 100 mmol/L (ref 98–111)
Creatinine, Ser: 0.68 mg/dL (ref 0.61–1.24)
GFR, Estimated: >60 mL/min (ref >60)
Glucose, Bld: 107 mg/dL — ABNORMAL HIGH (ref 70–99)
Potassium: 3.7 mmol/L (ref 3.5–5.1)
Sodium: 134 mmol/L — ABNORMAL LOW (ref 135–145)
Total Bilirubin: 0.6 mg/dL (ref <1.2)
Total Protein: 6.2 g/dL — ABNORMAL LOW (ref 6.5–8.1)

## 2022-11-05 MED ORDER — POTASSIUM CHLORIDE CRYS ER 20 MEQ PO TBCR
40.0000 meq | EXTENDED_RELEASE_TABLET | Freq: Once | ORAL | Status: AC
  Administered 2022-11-05: 40 meq via ORAL
  Filled 2022-11-05: qty 2

## 2022-11-05 NOTE — Progress Notes (Signed)
[facility] Progress Note Patient: Rick Taylor ONG:[id number] DOB: 1990/02/18 DOA: 10/30/2022  DOS: the patient was seen and examined on 11/05/2022  Brief hospital course: PMH of polysubstance abuse presented to the hospital with complaints of right leg pain. Currently being treated for Enterococcus bacteremia as well as right leg cellulitis. 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 Cardiology and cardiothoracic surgery (Dr. Leafy Ro) felt that the patient is not a surgical candidate.  Current plan is supportive care with IV antibiotics. Eventually need to be considered for debridement of right leg wound and reconsideration for CT surgery evaluation as he clinically improves and stay sober.  Assessment and Plan: Right leg cellulitis. E faecalis bacteremia. 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 Presents with complaints of right leg pain. X-ray and MRI negative for osteomyelitis or deep abscess but does show possibility of a cellulitis. ABI evaluation shows adequate circulation. Orthopedic recommended local wound care for now. Blood culture came back positive for E faecalis. Appreciate ID consultation. TTE shows evidence of preserved EF but torrential AI and severe MR with LV dilation. Cardiology was formally consulted. Initial plan was to transfer to [facility] for cardiothoracic surgery evaluation. Dr. Leafy Ro reviewed the chart and recommended that the patient is not a surgical candidate for now due to ongoing heroin use. Cardiology recommended to consider palliative care consultation and DNR. CT abdomen performed showed evidence of splenic and renal infarct as well.  Polysubstance abuse. 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 He is UDS is positive for amphetamine, opiates, cocaine, cannabis.  (UDS performed after receiving opiates in the hospital) Reports 3-4 times fentanyl  use at home. At present patient is critically ill.  Most likely will require prolonged IV antibiotic in the hospital.  May require surgical debridement of for his wound as well. Has a heavy use of opioid use and likely has undergone withdrawal.  Currently improving. Patient is willing to stop abusing substances. Continue Suboxone.  Continue clonidine.  Add clonazepam.  Continue Ativan as needed. Continue BuSpar for anxiety.  Acute on chronic diastolic CHF. In the setting of severe MR and AI. Management per cardiology.  Currently receiving IV diuresis.  Right foot cellulitis.  No evidence of osteomyelitis/polymyositis/abscess/fasciitis. MRI resulted in 11/2.  Reassuring for no evidence of osteomyelitis. Orthopedic actually has been following in the hospital and I also earlier discussed with Dr. Lajoyce Corners on phone 11/1.   Based on cardiology and CT surgery recommendation currently further management of wound is conservative only.  Suspect will require local debridement once stable from cardiac perspective. Will monitor progression.  Attempt for substance use in the hospital. Patient was caught attempting to inject substance in the hospital on 10/31. Security was called in, multiple syringes and needles were identified in his personal belongings as well as different substance. Visitation restricted. Telemonitoring initiated.  Hyponatremia. Metabolic acidosis. Etiology not clear for now we will monitor.  Elevated LFT. AST less than ALT. Bilirubin normal. Again etiology not clear. Will monitor. Further workup pending trend.  Anemia of chronic disease. Hemoglobin dropped down to 6.9. Requiring blood transfusion. Fibrinogen normal.  INR 1.5.  Reticulocyte count elevated. Transfuse for hemoglobin less than 8. Posttransfusion H&H is stable.    Thrombocytopenia. Improving. For now monitor.   Moderate protein calorie malnutrition  Nutrition Problem: Moderate Malnutrition Etiology:  social / environmental circumstances Nutrition Interventions: Interventions: Refer to RD note for recommendations Body mass index is 20.51 kg/m.   Goals of care  conversation. Multiple discussion with the patient with regards to his goals of care. Patient appears to have severe aortic regurgitation as well as mitral regurgitation. Appears to be developing pulmonary edema. Appears to be at risk for cardiogenic shock. With ongoing withdrawal and ongoing substance abuse. Per CT surgery " per Dr. Leafy Ro, consulting CT surgeon: Ongoing heroin use and a chronic wound that would need to be dealt with way before he would ever be considered a surgical candidate and also would need to be off heroine" " Per cardiology recommended palliative care and DNR. I encouraged with the patient and mother on the phone. At present given his hemodynamic situation attempting CPR or intubation would not result in a good outcome for his quality of life. Patient currently understands this and agrees to transition to DNR DNI.  Mother also understands that. Will continue to diurese and supportive care. Cardiology will continue to follow to manage heart failure.  Coagulopathy. INR elevated 1.5 Improving with vitamin K.  Constipation Due to severe abdominal pain a CT abdomen was performed.  No acute abnormality seen other than some constipation. Continue bowel regimen.  Bilateral pleural effusion. CT chest shows evidence of bilateral pleural effusion.  Anticipating improvement with just diuresis as long as here continues to improve.  Repeat chest x-ray in a few days.   Subjective: No nausea no vomiting no fever no chills.  No chest pain.  Breathing improving.  Physical Exam: General: in Mild distress, No Rash Cardiovascular: S1 and S2 Present, No Murmur Respiratory: Good respiratory effort, Bilateral Air entry present. No Crackles, No wheezes Abdomen: Bowel Sound present, No tenderness Extremities: Bilateral  edema Neuro: Alert and oriented x3, no new focal deficit  Data Reviewed: I have Reviewed nursing notes, Vitals, and Lab results. Since last encounter, pertinent lab results CBC and BMP   . I have ordered test including CBC and BMP  .   Disposition: Status is: Inpatient Remains inpatient appropriate because: Monitor for improvement in volume status and will require prolonged IV antibiotic.  enoxaparin (LOVENOX) injection 40 mg Start: 11/03/22 1000   Family Communication: No one at bedside Level of care: Progressive   Vitals:   11/05/22 0004 11/05/22 0600 11/05/22 0745 11/05/22 1445  BP: 111/64  111/66 116/60  Pulse: 95  97 99  Resp: 16  (!) 30 (!) 22  Temp: (!) 96.1 F (35.6 C) (!) 96.3 F (35.7 C) 97.7 F (36.5 C) 98.1 F (36.7 C)  TempSrc: Tympanic Axillary Oral   SpO2: 100%  98% 99%  Weight:      Height:         Author: Lynden Oxford, MD 11/05/2022 5:17 PM  Please look on [email or web address] to find out who is on call.

## 2022-11-05 NOTE — Progress Notes (Signed)
Chaplain engaged in a follow-up visit with Rick Taylor. Con expressed his desire to rest. Chaplain offered understanding and briefly checked in. He was grateful for Chaplain's visit.     11/05/22 1100  Spiritual Encounters  Type of Visit Follow up  Care provided to: Patient

## 2022-11-05 NOTE — Progress Notes (Signed)
[facility]   Reason for visit: Follow up on E faecalis bacteremia with AV endocarditis  Interval History: no plan for surgery noted; repeat blood cultures ngtd from 11/1 Day 7 total antibiotics  Physical Exam: Constitutional:  Vitals:   11/05/22 0600 11/05/22 0745  BP:  111/66  Pulse:  97  Resp:  (!) 30  Temp: (!) 96.3 F (35.7 C) 97.7 F (36.5 C)  SpO2:  98%   patient appears in NAD Respiratory: Normal respiratory effort  Review of Systems: Constitutional: negative for fevers and chills  Lab Results  Component Value Date   WBC 16.8 (H) 11/05/2022   HGB 8.5 (L) 11/05/2022   HCT 29.0 (L) 11/05/2022   MCV 81.7 11/05/2022   PLT 271 11/05/2022    Lab Results  Component Value Date   CREATININE 0.68 11/05/2022   BUN 21 (H) 11/05/2022   NA 134 (L) 11/05/2022   K 3.7 11/05/2022   CL 100 11/05/2022   CO2 22 11/05/2022    Lab Results  Component Value Date   ALT 49 (H) 11/05/2022   AST 26 11/05/2022   ALKPHOS 103 11/05/2022     Microbiology: Recent Results (from the past 240 hour(s))  Blood culture (routine x 2)     Status: Abnormal   Collection Time: 10/31/22  4:05 AM   Specimen: BLOOD RIGHT FOREARM  Result Value Ref Range Status   Specimen Description   Final    BLOOD RIGHT FOREARM Performed at [facility], [address], [place], [place] [address]    Special Requests   Final    BOTTLES DRAWN AEROBIC AND ANAEROBIC Blood Culture adequate volume Performed at [facility], [address], [place], [place] [address]    Culture  Setup Time   Final    GRAM POSITIVE COCCI IN PAIRS IN BOTH AEROBIC AND ANAEROBIC BOTTLES CRITICAL RESULT CALLED TO, READ BACK BY AND VERIFIED WITH: PHARMD C. AHMEND [id number] @ 1749 FH    Culture (A)  Final    ENTEROCOCCUS FAECALIS SUSCEPTIBILITIES PERFORMED ON PREVIOUS CULTURE WITHIN THE LAST 5 DAYS. Performed at [facility], [address] [place], [place] [address]     Report Status 11/02/2022 FINAL  Final  Blood culture (routine x 2)     Status: Abnormal   Collection Time: 10/31/22  4:05 AM   Specimen: BLOOD LEFT FOREARM  Result Value Ref Range Status   Specimen Description   Final    BLOOD LEFT FOREARM Performed at [facility], [address], [place], [place] [address]    Special Requests   Final    BOTTLES DRAWN AEROBIC AND ANAEROBIC Blood Culture adequate volume Performed at [facility], [address] [place], [place] [address]    Culture  Setup Time   Final    GRAM POSITIVE COCCI IN BOTH AEROBIC AND ANAEROBIC BOTTLES PHARMD A. [facility] [id number] @ 1745 FH Performed at [facility], [address] [place], [place] [address]    Culture ENTEROCOCCUS FAECALIS (A)  Final   Report Status 11/02/2022 FINAL  Final   Organism ID, Bacteria ENTEROCOCCUS FAECALIS  Final      Susceptibility   Enterococcus faecalis - MIC*    AMPICILLIN <=2 SENSITIVE Sensitive     VANCOMYCIN 1 SENSITIVE Sensitive     GENTAMICIN SYNERGY SENSITIVE Sensitive     * ENTEROCOCCUS FAECALIS  Blood Culture ID Panel (Reflexed)  Status: Abnormal   Collection Time: 10/31/22  4:05 AM  Result Value Ref Range Status   Enterococcus faecalis DETECTED (A) NOT DETECTED Final    Comment: CRITICAL RESULT CALLED TO, READ BACK BY AND VERIFIED WITH: PHARMD A. PHAM [id number] @ 1745 FH    Enterococcus Faecium NOT DETECTED NOT DETECTED Final   Listeria monocytogenes NOT DETECTED NOT DETECTED Final   Staphylococcus species NOT DETECTED NOT DETECTED Final   Staphylococcus aureus (BCID) NOT DETECTED NOT DETECTED Final   Staphylococcus epidermidis NOT DETECTED NOT DETECTED Final   Staphylococcus lugdunensis NOT DETECTED NOT DETECTED Final   Streptococcus species NOT DETECTED NOT DETECTED Final   Streptococcus agalactiae NOT DETECTED NOT DETECTED Final   Streptococcus pneumoniae NOT DETECTED NOT DETECTED Final   Streptococcus pyogenes NOT DETECTED NOT DETECTED Final    A.calcoaceticus-baumannii NOT DETECTED NOT DETECTED Final   Bacteroides fragilis NOT DETECTED NOT DETECTED Final   Enterobacterales NOT DETECTED NOT DETECTED Final   Enterobacter cloacae complex NOT DETECTED NOT DETECTED Final   Escherichia coli NOT DETECTED NOT DETECTED Final   Klebsiella aerogenes NOT DETECTED NOT DETECTED Final   Klebsiella oxytoca NOT DETECTED NOT DETECTED Final   Klebsiella pneumoniae NOT DETECTED NOT DETECTED Final   Proteus species NOT DETECTED NOT DETECTED Final   Salmonella species NOT DETECTED NOT DETECTED Final   Serratia marcescens NOT DETECTED NOT DETECTED Final   Haemophilus influenzae NOT DETECTED NOT DETECTED Final   Neisseria meningitidis NOT DETECTED NOT DETECTED Final   Pseudomonas aeruginosa NOT DETECTED NOT DETECTED Final   Stenotrophomonas maltophilia NOT DETECTED NOT DETECTED Final   Candida albicans NOT DETECTED NOT DETECTED Final   Candida auris NOT DETECTED NOT DETECTED Final   Candida glabrata NOT DETECTED NOT DETECTED Final   Candida krusei NOT DETECTED NOT DETECTED Final   Candida parapsilosis NOT DETECTED NOT DETECTED Final   Candida tropicalis NOT DETECTED NOT DETECTED Final   Cryptococcus neoformans/gattii NOT DETECTED NOT DETECTED Final   Vancomycin resistance NOT DETECTED NOT DETECTED Final    Comment: Performed at [facility], [address], [place], [place] [address]  MRSA Next Gen by PCR, Nasal     Status: Abnormal   Collection Time: 11/01/22 12:25 PM   Specimen: Nasal Mucosa; Nasal Swab  Result Value Ref Range Status   MRSA by PCR Next Gen DETECTED (A) NOT DETECTED Final    Comment: RESULT CALLED TO, READ BACK BY AND VERIFIED WITH: PECHETTE, F RN @ 1859 ON 11/01/2022 BY ABDULHALIM,M (NOTE) The GeneXpert MRSA Assay (FDA approved for NASAL specimens only), is one component of a comprehensive MRSA colonization surveillance program. It is not intended to diagnose MRSA infection nor to guide or monitor treatment for MRSA  infections. Test performance is not FDA approved in patients less than 44 years old. Performed at [facility], [address], [place], [place] [address]   Culture, blood (Routine X 2) w Reflex to ID Panel     Status: None (Preliminary result)   Collection Time: 11/02/22  5:46 AM   Specimen: BLOOD  Result Value Ref Range Status   Specimen Description   Final    BLOOD BLOOD LEFT ARM Performed at [facility], [address], [place], [place] [address]    Special Requests   Final    BOTTLES DRAWN AEROBIC AND ANAEROBIC Blood Culture adequate volume Performed at [facility], [address], [place], [place] [address]    Culture   Final  NO GROWTH 2 DAYS Performed at [facility], [address] [place], [place] [address]    Report Status PENDING  Incomplete  Culture, blood (Routine X 2) w Reflex to ID Panel     Status: None (Preliminary result)   Collection Time: 11/02/22  5:46 AM   Specimen: BLOOD  Result Value Ref Range Status   Specimen Description   Final    BLOOD BLOOD LEFT HAND Performed at [facility], [address], [place], [place] [address]    Special Requests   Final    BOTTLES DRAWN AEROBIC AND ANAEROBIC Blood Culture results may not be optimal due to an inadequate volume of blood received in culture bottles Performed at [facility], [address] [place], [place] [address]    Culture   Final    NO GROWTH 2 DAYS Performed at [facility], [address] [place], [place] [address]    Report Status PENDING  Incomplete    Impression/Plan:  1. Bacteremia with endocarditis - recent notes reviewed and no plan for surgery.  Bacteremia clearing with no new positive cultures.   Tolerating antibiotics and no changes indicatd.   Will continue to monitor intermittently  I have personally spent 51 minutes involved in face-to-face and non-face-to-face  activities for this patient on the day of the visit. Professional time spent includes the following activities: Preparing to see the patient (review of tests), Obtaining and/or reviewing separately obtained history (admission/discharge record), Performing a medically appropriate examination and/or evaluation , Ordering medications/tests/procedures, referring and communicating with other health care professionals, Documenting clinical information in the EMR, Independently interpreting results (not separately reported), Communicating results to the patient/family/caregiver, Counseling and educating the patient/family/caregiver and Care coordination (not separately reported).

## 2022-11-05 NOTE — Progress Notes (Signed)
   Patient Name: Rick Taylor Date of Encounter: 11/05/2022 [facility] Cardiologist: None   Interval Summary  .    Patient denies pain. Breathing feels better.  Vital Signs .    Vitals:   11/04/22 2200 11/05/22 0004 11/05/22 0600 11/05/22 0745  BP:  111/64  111/66  Pulse:  95  97  Resp:  16  (!) 30  Temp: (!) 97 F (36.1 C) (!) 96.1 F (35.6 C) (!) 96.3 F (35.7 C) 97.7 F (36.5 C)  TempSrc: Axillary Tympanic Axillary Oral  SpO2:  100%  98%  Weight:      Height:        Intake/Output Summary (Last 24 hours) at 11/05/2022 1052 Last data filed at 11/05/2022 1017 Gross per 24 hour  Intake 2513.28 ml  Output 3700 ml  Net -1186.72 ml      10/31/2022   11:49 AM 10/31/2022    4:43 AM  Last 3 Weights  Weight (lbs) 155 lb 6.8 oz 150 lb  Weight (kg) 70.5 kg 68.04 kg       Physical Exam .   GEN: No acute distress.  Laying in the bed, sleeping but arouses easily to voice  Neck: No JVD Cardiac: RRR, grade 2/6 diastolic murmur  Respiratory: Clear to auscultation bilaterally. Normal work of breathing GI: Soft, nontender, non-distended  MS: No edema in BLE   Assessment & Plan .     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 BNP >4500.  CXR with mild-moderate edema, perihilar and bibasilar patchy opacities consistent with alveolar edema - Now on IV lasix 60 mg BID- output 2.35 L urine yesterday and 1.85 L so far today. Renal function stable. Appears euvolemic on exam, continues to be on 2 L via [place]   - Continue IV lasix for now. Suspect he can be transitioned to PO diuretics tomorrow    For questions or updates, please contact [facility] Please consult [email or web address] for contact info under        Signed, Jonita Albee, PA-C

## 2022-11-05 NOTE — Plan of Care (Signed)
  Problem: Education: Goal: Knowledge of General Education information will improve Description: Including pain rating scale, medication(s)/side effects and non-pharmacologic comfort measures Outcome: Progressing   Problem: Nutrition: Goal: Adequate nutrition will be maintained Outcome: Progressing   Problem: Coping: Goal: Level of anxiety will decrease Outcome: Progressing   Problem: Elimination: Goal: Will not experience complications related to bowel motility Outcome: Progressing Goal: Will not experience complications related to urinary retention Outcome: Progressing   Problem: Pain Management: Goal: General experience of comfort will improve Outcome: Progressing   Problem: Safety: Goal: Ability to remain free from injury will improve Outcome: Progressing   Problem: Skin Integrity: Goal: Risk for impaired skin integrity will decrease Outcome: Progressing   Problem: Health Behavior/Discharge Planning: Goal: Ability to manage health-related needs will improve Outcome: Not Progressing   Problem: Clinical Measurements: Goal: Ability to maintain clinical measurements within normal limits will improve Outcome: Not Progressing Goal: Will remain free from infection Outcome: Not Progressing Goal: Diagnostic test results will improve Outcome: Not Progressing Goal: Respiratory complications will improve Outcome: Not Progressing Goal: Cardiovascular complication will be avoided Outcome: Not Progressing   Problem: Activity: Goal: Risk for activity intolerance will decrease Outcome: Not Progressing

## 2022-11-06 LAB — CBC WITH DIFFERENTIAL/PLATELET
Abs Immature Granulocytes: 0.14 10*3/uL — ABNORMAL HIGH (ref 0.00–0.07)
Basophils Absolute: 0 10*3/uL (ref 0.0–0.1)
Basophils Relative: 0 %
Eosinophils Absolute: 0.1 10*3/uL (ref 0.0–0.5)
Eosinophils Relative: 0 %
HCT: 34.9 % — ABNORMAL LOW (ref 39.0–52.0)
Hemoglobin: 10.3 g/dL — ABNORMAL LOW (ref 13.0–17.0)
Immature Granulocytes: 1 %
Lymphocytes Relative: 12 %
Lymphs Abs: 2.2 10*3/uL (ref 0.7–4.0)
MCH: 24.5 pg — ABNORMAL LOW (ref 26.0–34.0)
MCHC: 29.5 g/dL — ABNORMAL LOW (ref 30.0–36.0)
MCV: 82.9 fL (ref 80.0–100.0)
Monocytes Absolute: 0.9 10*3/uL (ref 0.1–1.0)
Monocytes Relative: 5 %
Neutro Abs: 14.4 10*3/uL — ABNORMAL HIGH (ref 1.7–7.7)
Neutrophils Relative %: 82 %
Platelets: 356 10*3/uL (ref 150–400)
RBC: 4.21 MIL/uL — ABNORMAL LOW (ref 4.22–5.81)
RDW: 22.5 % — ABNORMAL HIGH (ref 11.5–15.5)
WBC: 17.7 10*3/uL — ABNORMAL HIGH (ref 4.0–10.5)
nRBC: 0 % (ref 0.0–0.2)

## 2022-11-06 LAB — COMPREHENSIVE METABOLIC PANEL
ALT: 55 U/L — ABNORMAL HIGH (ref 0–44)
AST: 42 U/L — ABNORMAL HIGH (ref 15–41)
Albumin: 2.2 g/dL — ABNORMAL LOW (ref 3.5–5.0)
Alkaline Phosphatase: 105 U/L (ref 38–126)
Anion gap: 12 (ref 5–15)
BUN: 14 mg/dL (ref 6–20)
CO2: 22 mmol/L (ref 22–32)
Calcium: 8 mg/dL — ABNORMAL LOW (ref 8.9–10.3)
Chloride: 97 mmol/L — ABNORMAL LOW (ref 98–111)
Creatinine, Ser: 0.56 mg/dL — ABNORMAL LOW (ref 0.61–1.24)
GFR, Estimated: >60 mL/min (ref >60)
Glucose, Bld: 107 mg/dL — ABNORMAL HIGH (ref 70–99)
Potassium: 3.6 mmol/L (ref 3.5–5.1)
Sodium: 131 mmol/L — ABNORMAL LOW (ref 135–145)
Total Bilirubin: 0.6 mg/dL (ref <1.2)
Total Protein: 6.7 g/dL (ref 6.5–8.1)

## 2022-11-06 LAB — MAGNESIUM: Magnesium: 2 mg/dL (ref 1.7–2.4)

## 2022-11-06 MED ORDER — CLONIDINE HCL 0.1 MG PO TABS
0.0500 mg | ORAL_TABLET | Freq: Every day | ORAL | Status: DC
  Administered 2022-11-07 – 2022-11-17 (×10): 0.05 mg via ORAL
  Filled 2022-11-06 (×11): qty 1

## 2022-11-06 MED ORDER — DOCUSATE SODIUM 100 MG PO CAPS
100.0000 mg | ORAL_CAPSULE | Freq: Every day | ORAL | Status: DC
  Administered 2022-11-07: 100 mg via ORAL
  Filled 2022-11-06 (×8): qty 1

## 2022-11-06 MED ORDER — DICYCLOMINE HCL 20 MG PO TABS
20.0000 mg | ORAL_TABLET | Freq: Four times a day (QID) | ORAL | Status: DC | PRN
  Administered 2022-11-14: 20 mg via ORAL
  Filled 2022-11-06 (×2): qty 1

## 2022-11-06 MED ORDER — FUROSEMIDE 40 MG PO TABS
80.0000 mg | ORAL_TABLET | Freq: Every day | ORAL | Status: DC
  Administered 2022-11-06 – 2022-11-17 (×12): 80 mg via ORAL
  Filled 2022-11-06 (×12): qty 2

## 2022-11-06 MED ORDER — BOOST / RESOURCE BREEZE PO LIQD CUSTOM
1.0000 | Freq: Two times a day (BID) | ORAL | Status: DC
  Administered 2022-11-06 – 2022-11-11 (×3): 1 via ORAL

## 2022-11-06 MED ORDER — ENSURE MAX PROTEIN PO LIQD
11.0000 [oz_av] | Freq: Every day | ORAL | Status: DC
  Administered 2022-11-13: 11 [oz_av] via ORAL
  Filled 2022-11-06 (×12): qty 330

## 2022-11-06 NOTE — Progress Notes (Signed)
Nutrition Follow-up  DOCUMENTATION CODES:   Non-severe (moderate) malnutrition in context of social or environmental circumstances  INTERVENTION:  - Heart Healthy diet per MD.  - Boost Breeze po BID, each supplement provides 250 kcal and 9 grams of protein - Ensure Max po once daily, provides 150 kcal and 30 grams of protein.   -Encourage intake at all meals of protein rich food sources in addition to supplements. - Continue Multivitamin with minerals daily, 500mg  vitamin C BID, 220mg  zinc x14 days to support wound healing.  - Monitor weight trends.    NUTRITION DIAGNOSIS:   Moderate Malnutrition related to social / environmental circumstances as evidenced by severe fat depletion, moderate muscle depletion. *ongoing  GOAL:   Patient will meet greater than or equal to 90% of their needs *progressing  MONITOR:   PO intake, Supplement acceptance, Diet advancement, Weight trends  REASON FOR ASSESSMENT:   Consult Wound healing  ASSESSMENT:   32 y.o. male who presented with infected wound to right lower extremity. Admitted for sepsis due to cellulitis.  Patient reports eating 3 melas a day with fair appetite. He is documented to be consuming 85-100% of meals. Had been drinking Ensure but noted to be refusing and/or Ensure not available the past few days. Patient reports it started to give him diarrhea. Question if diarrhea more related to antibiotics patient currently receiving. He would like to try something else so will change to Ensure Max and Boost Breeze for other options. Encouraged patient to continue to eat well and focus on protein rich food sources to promote healing.    Medications reviewed and include: 500mg  vitamin C BID, 220mg  zinc x14 days (ends 11/13), MVI, vitamin B12, 325mg  ferrous sulfate, Colace, Lasix  Labs reviewed:  Na 134   Diet Order:   Diet Order             Diet Heart Fluid consistency: Thin  Diet effective now                    EDUCATION NEEDS:  Education needs have been addressed  Skin:  Skin Assessment: Skin Integrity Issues: Skin Integrity Issues:: Other (Comment) Other: Right leg cellulitis  Last BM:  11/5 - type 6  Height:  Ht Readings from Last 1 Encounters:  10/31/22 6\' 1"  (1.854 m)   Weight:  Wt Readings from Last 1 Encounters:  10/31/22 70.5 kg   BMI:  Body mass index is 20.51 kg/m.  Estimated Nutritional Needs:  Kcal:  2050-2250 kcals Protein:  100-135 grams Fluid:  >/= 2L    Shelle Iron RD, LDN For contact information, refer to [facility].

## 2022-11-06 NOTE — Progress Notes (Addendum)
   Patient Name: Rick Taylor Date of Encounter: 11/06/2022 [facility] Cardiologist: None   Interval Summary  .    No issues today. Had a discussion about his prognosis  Vital Signs .    Vitals:   11/05/22 1937 11/06/22 0450 11/06/22 0818 11/06/22 1307  BP: 118/61 120/69 110/65 (!) 108/55  Pulse: (!) 102 98 (!) 101 97  Resp: 18 (!) 21 19 17   Temp: 97.8 F (36.6 C) 98.2 F (36.8 C)  98.3 F (36.8 C)  TempSrc: Oral Oral  Oral  SpO2: 99% 100% 100% 99%  Weight:      Height:        Intake/Output Summary (Last 24 hours) at 11/06/2022 1309 Last data filed at 11/06/2022 1037 Gross per 24 hour  Intake 3629.94 ml  Output 3900 ml  Net -270.06 ml      10/31/2022   11:49 AM 10/31/2022    4:43 AM  Last 3 Weights  Weight (lbs) 155 lb 6.8 oz 150 lb  Weight (kg) 70.5 kg 68.04 kg       Physical Exam .   GEN: No acute distress.  Flushed Neck: No JVD Cardiac: RRR, grade 2/6 diastolic murmur  Respiratory: Clear to auscultation bilaterally. Normal work of breathing GI: Soft, nontender, non-distended  MS: No edema in BLE   Assessment & Plan .     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 - Patient currently DNR, being followed by palliative care. I discussed with him today Maybe that considering there are no surgical options for him that his outlook is dire. 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 BNP >4500. CXR with mild-moderate edema, perihilar and bibasilar patchy opacities consistent with alveolar edema - Now on IV lasix 60 mg BID- output 2.35 L urine yesterday and 1.85 L so far today. Renal function stable. Appears euvolemic on exam, continues to be on 2 L via [place]   --->changed his diuretics to oral lasix 80 mg daily, he is euvolemic  I don't anticipate any further medication changes for [name]. I can continue to follow peripherally. If no other significant changes in his clinical status cardiology will then sign off. Don't hesitate to reach out for further questions.   Time Spent Directly with Patient:  I have spent a total of 35 minutes with the patient reviewing hospital notes, telemetry, EKGs, labs and examining the patient as well as establishing an assessment and plan that was discussed personally with the patient.  > 50% of time was spent in direct patient care.   For questions or updates, please contact [facility] Please consult [email or web address] for contact info under        Signed, Christoher Drudge, Alben Spittle, MD

## 2022-11-06 NOTE — Progress Notes (Signed)
[facility] Progress Note Patient: Rick Taylor ZOX:[id number] DOB: 09-06-1990 DOA: 10/30/2022  DOS: the patient was seen and examined on 11/06/2022  Brief hospital course: PMH of polysubstance abuse presented to the hospital with complaints of right leg pain. Currently being treated for Enterococcus bacteremia as well as right leg cellulitis. 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 Cardiology and cardiothoracic surgery (Dr. Leafy Ro) felt that the patient is not a surgical candidate.  Current plan is supportive care with IV antibiotics. Eventually need to be considered for debridement of right leg wound and reconsideration for CT surgery evaluation as he clinically improves and stay sober.  Assessment and Plan: Right leg cellulitis. E faecalis bacteremia. 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 Presents with complaints of right leg pain. X-ray and MRI negative for osteomyelitis or deep abscess but does show possibility of a cellulitis. ABI evaluation shows adequate circulation. Orthopedic recommended local wound care for now. Blood culture came back positive for E faecalis. Appreciate ID consultation. TTE shows evidence of preserved EF but torrential AI and severe MR with LV dilation. Cardiology was formally consulted. Initial plan was to transfer to [facility] for cardiothoracic surgery evaluation. Dr. Leafy Ro reviewed the chart and recommended that the patient is not a surgical candidate for now due to ongoing heroin use. Cardiology recommended to consider palliative care consultation and DNR. CT abdomen performed showed evidence of splenic and renal infarct as well. I believe that down the road patient should be reevaluated by CT surgery for consideration for surgical intervention.  Polysubstance abuse. 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 He is UDS is positive for  amphetamine, opiates, cocaine, cannabis.  (UDS performed after receiving opiates in the hospital) Reports 3-4 times fentanyl use at home. At present patient is critically ill.  Most likely will require prolonged IV antibiotic in the hospital.  May require surgical debridement of for his wound as well. Has a heavy use of opioid use and likely has undergone withdrawal.  Currently improving. Patient is willing to stop abusing substances. Continue Suboxone.  Continue clonidine.  Add clonazepam.  Continue Ativan as needed. Continue BuSpar for anxiety.  Acute on chronic diastolic CHF. In the setting of severe MR and AI. Management per cardiology.  Was receiving IV diuresis. Now switching to oral diuresis. Cardiology need to recommend timing for TEE for this patient for further workup of his infective endocarditis.  Right foot cellulitis.  No evidence of osteomyelitis/polymyositis/abscess/fasciitis. MRI resulted in 11/2.  Reassuring for no evidence of osteomyelitis. Orthopedic actually has been following in the hospital and I also earlier discussed with Dr. Lajoyce Corners on phone 11/1.   Based on cardiology and CT surgery recommendation currently further management of wound is conservative only.  Suspect will require local debridement once stable from cardiac perspective.  Will await cardiology clearance for timing of intervention.  Attempt for substance use in the hospital. Patient was caught attempting to inject substance in the hospital on 10/31. Security was called in, multiple syringes and needles were identified in his personal belongings as well as different substance. Visitation restricted. Telemonitoring initiated.  Hyponatremia. Metabolic acidosis. Etiology not clear for now we will monitor.  Elevated LFT. AST less than ALT. Bilirubin normal. Again etiology not clear. Will monitor. Further workup pending trend.  Anemia of chronic disease. Hemoglobin dropped down to 6.9. Requiring blood  transfusion. Fibrinogen normal.  INR 1.5.  Reticulocyte count elevated. Transfuse for hemoglobin less than 8. Posttransfusion H&H  is stable.    Thrombocytopenia. Improving. For now monitor.   Moderate protein calorie malnutrition  Nutrition Problem: Moderate Malnutrition Etiology: social / environmental circumstances Nutrition Interventions: Interventions: Refer to RD note for recommendations Body mass index is 20.51 kg/m.   Goals of care conversation. Multiple discussion with the patient with regards to his goals of care. Patient appears to have severe aortic regurgitation as well as mitral regurgitation. Appears to be developing pulmonary edema. Appears to be at risk for cardiogenic shock. With ongoing withdrawal and ongoing substance abuse. Per CT surgery " per Dr. Leafy Ro, consulting CT surgeon: Ongoing heroin use and a chronic wound that would need to be dealt with way before he would ever be considered a surgical candidate and also would need to be off heroine" " Per cardiology recommended palliative care and DNR. I encouraged with the patient and mother on the phone. At present given his hemodynamic situation attempting CPR or intubation would not result in a good outcome for his quality of life. Patient currently understands this and agrees to transition to DNR DNI.  Mother also understands that. Will continue to diurese and supportive care. Cardiology will continue to follow to manage heart failure.  Coagulopathy. INR elevated 1.5 Improving with vitamin K.  Constipation Due to severe abdominal pain a CT abdomen was performed.  No acute abnormality seen other than some constipation. Continue bowel regimen.  Bilateral pleural effusion. CT chest shows evidence of bilateral pleural effusion.  Anticipating improvement with just diuresis as long as here continues to improve.  Repeat chest x-ray in a few days.   Subjective: No nausea no vomiting.  No fever no chills.  Had  a BM.  Physical Exam: General: in Mild distress, No Rash Cardiovascular: S1 and S2 Present, No Murmur Respiratory: Good respiratory effort, Bilateral Air entry present. No Crackles, No wheezes Abdomen: Bowel Sound present, No tenderness Extremities: No edema Neuro: Alert and oriented x3, no new focal deficit  Data Reviewed: I have Reviewed nursing notes, Vitals, and Lab results. Since last encounter, pertinent lab results CBC and BMP   . I have ordered test including CBC and BMP  .  Discussed with ID.  Unable to reach cardiology.  Disposition: Status is: Inpatient Remains inpatient appropriate because: Need further workup and IV antibiotics.  enoxaparin (LOVENOX) injection 40 mg Start: 11/03/22 1000   Family Communication: No one at bedside Level of care: Progressive   Vitals:   11/05/22 1937 11/06/22 0450 11/06/22 0818 11/06/22 1307  BP: 118/61 120/69 110/65 (!) 108/55  Pulse: (!) 102 98 (!) 101 97  Resp: 18 (!) 21 19 17   Temp: 97.8 F (36.6 C) 98.2 F (36.8 C)  98.3 F (36.8 C)  TempSrc: Oral Oral  Oral  SpO2: 99% 100% 100% 99%  Weight:      Height:         Author: Lynden Oxford, MD 11/06/2022 7:50 PM  Please look on [email or web address] to find out who is on call.

## 2022-11-07 ENCOUNTER — Inpatient Hospital Stay (HOSPITAL_COMMUNITY): Payer: Self-pay

## 2022-11-07 LAB — CBC WITH DIFFERENTIAL/PLATELET
Abs Immature Granulocytes: 0.1 10*3/uL — ABNORMAL HIGH (ref 0.00–0.07)
Basophils Absolute: 0.1 10*3/uL (ref 0.0–0.1)
Basophils Relative: 0 %
Eosinophils Absolute: 0.1 10*3/uL (ref 0.0–0.5)
Eosinophils Relative: 0 %
HCT: 33.1 % — ABNORMAL LOW (ref 39.0–52.0)
Hemoglobin: 9.9 g/dL — ABNORMAL LOW (ref 13.0–17.0)
Immature Granulocytes: 1 %
Lymphocytes Relative: 11 %
Lymphs Abs: 1.8 10*3/uL (ref 0.7–4.0)
MCH: 24.4 pg — ABNORMAL LOW (ref 26.0–34.0)
MCHC: 29.9 g/dL — ABNORMAL LOW (ref 30.0–36.0)
MCV: 81.5 fL (ref 80.0–100.0)
Monocytes Absolute: 0.8 10*3/uL (ref 0.1–1.0)
Monocytes Relative: 5 %
Neutro Abs: 13.6 10*3/uL — ABNORMAL HIGH (ref 1.7–7.7)
Neutrophils Relative %: 83 %
Platelets: 405 10*3/uL — ABNORMAL HIGH (ref 150–400)
RBC: 4.06 MIL/uL — ABNORMAL LOW (ref 4.22–5.81)
RDW: 22.1 % — ABNORMAL HIGH (ref 11.5–15.5)
WBC: 16.4 10*3/uL — ABNORMAL HIGH (ref 4.0–10.5)
nRBC: 0 % (ref 0.0–0.2)

## 2022-11-07 LAB — COMPREHENSIVE METABOLIC PANEL
ALT: 58 U/L — ABNORMAL HIGH (ref 0–44)
AST: 49 U/L — ABNORMAL HIGH (ref 15–41)
Albumin: 2.2 g/dL — ABNORMAL LOW (ref 3.5–5.0)
Alkaline Phosphatase: 108 U/L (ref 38–126)
Anion gap: 9 (ref 5–15)
BUN: 13 mg/dL (ref 6–20)
CO2: 25 mmol/L (ref 22–32)
Calcium: 8 mg/dL — ABNORMAL LOW (ref 8.9–10.3)
Chloride: 94 mmol/L — ABNORMAL LOW (ref 98–111)
Creatinine, Ser: 0.61 mg/dL (ref 0.61–1.24)
GFR, Estimated: >60 mL/min (ref >60)
Glucose, Bld: 112 mg/dL — ABNORMAL HIGH (ref 70–99)
Potassium: 4 mmol/L (ref 3.5–5.1)
Sodium: 128 mmol/L — ABNORMAL LOW (ref 135–145)
Total Bilirubin: 0.6 mg/dL (ref <1.2)
Total Protein: 6.7 g/dL (ref 6.5–8.1)

## 2022-11-07 LAB — CULTURE, BLOOD (ROUTINE X 2)
Culture: NO GROWTH
Culture: NO GROWTH
Special Requests: ADEQUATE

## 2022-11-07 LAB — MAGNESIUM: Magnesium: 2 mg/dL (ref 1.7–2.4)

## 2022-11-07 LAB — PROTIME-INR
INR: 1.4 — ABNORMAL HIGH (ref 0.8–1.2)
Prothrombin Time: 17.6 s — ABNORMAL HIGH (ref 11.4–15.2)

## 2022-11-07 MED ORDER — POLYETHYLENE GLYCOL 3350 17 G PO PACK
17.0000 g | PACK | Freq: Every day | ORAL | Status: DC
  Filled 2022-11-07 (×6): qty 1

## 2022-11-07 NOTE — Plan of Care (Signed)
  Problem: Education: Goal: Knowledge of General Education information will improve Description: Including pain rating scale, medication(s)/side effects and non-pharmacologic comfort measures Outcome: Progressing   Problem: Activity: Goal: Risk for activity intolerance will decrease Outcome: Progressing   Problem: Nutrition: Goal: Adequate nutrition will be maintained Outcome: Progressing   Problem: Pain Management: Goal: General experience of comfort will improve Outcome: Progressing

## 2022-11-07 NOTE — Progress Notes (Signed)
[facility] Progress Note  Patient: Rick Taylor MWU:[id number] DOB: Feb 10, 1990 DOA: 10/30/2022  DOS: the patient was seen and examined on 11/07/2022  Brief hospital course: PMH of polysubstance abuse presented to the hospital with complaints of right leg pain. Currently being treated for Enterococcus bacteremia as well as right leg cellulitis. 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 Cardiology and cardiothoracic surgery (Dr. Leafy Ro) felt that the patient is not a surgical candidate.  Current plan is supportive care with IV antibiotics. Eventually need to be considered for debridement of right leg wound and reconsideration for CT surgery evaluation as he clinically improves and hopefully stays sober    Today, pt had multiple questions including about prognosis, length of stay in hospital. He expressed quitting drug use. Denies any new complaints.   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 TTE shows evidence of preserved EF but torrential AI and severe MR with LV dilation. Cardiology was formally consulted, currently signed off Dr. Tereso Newcomer, reviewed the chart and recommended that the patient is not a surgical candidate for now due to ongoing heroin use Plan for reevaluation by CT surgery for consideration for surgical intervention Continue IV ampicillin, ceftriaxone  X ??total 6 weeks  Acute on chronic diastolic CHF Bilateral pleural effusion In the setting of severe MR and AI CT chest shows evidence of bilateral pleural effusion Management per cardiology, signed off Continue PO lasix Cardiology need to recommend timing for TEE for this patient for further workup of his infective endocarditis   Right foot cellulitis No evidence of  osteomyelitis/polymyositis/abscess/fasciitis MRI resulted in 11/2.  Reassuring for no evidence of osteomyelitis. Orthopedic rec local wound care/conservative  May require local debridement once stable from cardiac perspective.  Will await cardiology clearance for timing of intervention.   Polysubstance abuse Tobacco abuse Patient reports that he abuses fentanyl IV multiple times, daily He is UDS is positive for amphetamine, opiates, cocaine, cannabis.  (UDS performed after receiving opiates in the hospital) Patient is willing to quit  Continue Suboxone, clonidine, clonazepam, ativan as needed Continue BuSpar for anxiety Nicotine patch  Attempt for substance use in the hospital Patient was caught attempting to inject substance in the hospital on 10/31 Security was called in, multiple syringes and needles were identified in his personal belongings as well as different substance Visitation restricted Telemonitoring initiated   Hyponatremia Unclear etiology Possibly ?vol overload Daily CMP   Elevated LFT Coagulopathy- elevated INR Unclear etiology Bilirubin normal Possible from bacteremia S/p Vit K Daily CMP   Anemia of chronic disease Hemoglobin dropped down to 6.9. Requiring blood transfusion. Fibrinogen normal.  INR 1.5.  Reticulocyte count elevated. Transfuse for hemoglobin less than 8. Posttransfusion H&H is stable.     Thrombocytopenia Leukocytosis Likely 2/2 bacteremia For now monitor  Constipation Due to severe abdominal pain a CT abdomen was performed No acute abnormality seen other than some constipation. Continue bowel regimen.   Moderate protein calorie malnutrition  Nutrition Problem: Moderate Malnutrition Etiology: social / environmental circumstances Nutrition Interventions: Interventions: Refer to RD note for recommendations Body mass index is 20.51 kg/m.    Goals of care conversation Multiple discussion with the patient with regards to his goals  of care per previous provider Dr Allena Katz Per CT surgery " per Dr. Leafy Ro, consulting CT surgeon: Ongoing heroin  use and a chronic wound that would need to be dealt with way before he would ever be considered a surgical candidate and also would need to be off heroine" Per cardiology recommended palliative care and DNR. Patient currently understands this and has been transitioned to DNR DNI.  Mother also understands that           Physical Exam: General: NAD  Cardiovascular: S1, S2 present Respiratory: CTAB Abdomen: Soft, nontender, nondistended, bowel sounds present Musculoskeletal: No bilateral pedal edema noted Skin: Normal Psychiatry: Fair mood     Disposition: Status is: Inpatient Remains inpatient appropriate because: Need further workup and IV antibiotics.  enoxaparin (LOVENOX) injection 40 mg Start: 11/03/22 1000   Family Communication: No one at bedside  Level of care: Progressive   Vitals:   11/07/22 0400 11/07/22 0651 11/07/22 1003 11/07/22 1127  BP:  (!) 109/55 (!) 109/55 (!) 108/48  Pulse:  95  97  Resp: (!) 26 19  14   Temp:  99 F (37.2 C)  98 F (36.7 C)  TempSrc:  Oral  Oral  SpO2:  100%  99%  Weight:      Height:          Briant Cedar, MD 11/07/2022 3:35 PM  Please look on [email or web address] to find out who is on call.

## 2022-11-07 NOTE — Plan of Care (Signed)

## 2022-11-07 NOTE — Plan of Care (Signed)
  Problem: Education: Goal: Knowledge of General Education information will improve Description: Including pain rating scale, medication(s)/side effects and non-pharmacologic comfort measures Outcome: Progressing   Problem: Clinical Measurements: Goal: Respiratory complications will improve Outcome: Progressing   Problem: Elimination: Goal: Will not experience complications related to urinary retention Outcome: Progressing   Problem: Pain Management: Goal: General experience of comfort will improve Outcome: Progressing   Problem: Safety: Goal: Ability to remain free from injury will improve Outcome: Progressing

## 2022-11-08 LAB — CBC WITH DIFFERENTIAL/PLATELET
Abs Immature Granulocytes: 0.08 10*3/uL — ABNORMAL HIGH (ref 0.00–0.07)
Basophils Absolute: 0.1 10*3/uL (ref 0.0–0.1)
Basophils Relative: 0 %
Eosinophils Absolute: 0.1 10*3/uL (ref 0.0–0.5)
Eosinophils Relative: 1 %
HCT: 31.9 % — ABNORMAL LOW (ref 39.0–52.0)
Hemoglobin: 9.5 g/dL — ABNORMAL LOW (ref 13.0–17.0)
Immature Granulocytes: 1 %
Lymphocytes Relative: 12 %
Lymphs Abs: 1.9 10*3/uL (ref 0.7–4.0)
MCH: 24.4 pg — ABNORMAL LOW (ref 26.0–34.0)
MCHC: 29.8 g/dL — ABNORMAL LOW (ref 30.0–36.0)
MCV: 82 fL (ref 80.0–100.0)
Monocytes Absolute: 1 10*3/uL (ref 0.1–1.0)
Monocytes Relative: 6 %
Neutro Abs: 12.1 10*3/uL — ABNORMAL HIGH (ref 1.7–7.7)
Neutrophils Relative %: 80 %
Platelets: 424 10*3/uL — ABNORMAL HIGH (ref 150–400)
RBC: 3.89 MIL/uL — ABNORMAL LOW (ref 4.22–5.81)
RDW: 21.9 % — ABNORMAL HIGH (ref 11.5–15.5)
WBC: 15.1 10*3/uL — ABNORMAL HIGH (ref 4.0–10.5)
nRBC: 0 % (ref 0.0–0.2)

## 2022-11-08 LAB — COMPREHENSIVE METABOLIC PANEL
ALT: 49 U/L — ABNORMAL HIGH (ref 0–44)
AST: 35 U/L (ref 15–41)
Albumin: 2.1 g/dL — ABNORMAL LOW (ref 3.5–5.0)
Alkaline Phosphatase: 101 U/L (ref 38–126)
Anion gap: 11 (ref 5–15)
BUN: 12 mg/dL (ref 6–20)
CO2: 22 mmol/L (ref 22–32)
Calcium: 8 mg/dL — ABNORMAL LOW (ref 8.9–10.3)
Chloride: 95 mmol/L — ABNORMAL LOW (ref 98–111)
Creatinine, Ser: 0.61 mg/dL (ref 0.61–1.24)
GFR, Estimated: >60 mL/min (ref >60)
Glucose, Bld: 103 mg/dL — ABNORMAL HIGH (ref 70–99)
Potassium: 4.3 mmol/L (ref 3.5–5.1)
Sodium: 128 mmol/L — ABNORMAL LOW (ref 135–145)
Total Bilirubin: 0.3 mg/dL (ref <1.2)
Total Protein: 6.2 g/dL — ABNORMAL LOW (ref 6.5–8.1)

## 2022-11-08 NOTE — Plan of Care (Signed)
  Problem: Education: Goal: Knowledge of General Education information will improve Description Including pain rating scale, medication(s)/side effects and non-pharmacologic comfort measures Outcome: Progressing   

## 2022-11-08 NOTE — Progress Notes (Signed)
[facility] Progress Note  Patient: Rick Taylor ZHY:[id number] DOB: Feb 01, 1990 DOA: 10/30/2022  DOS: the patient was seen and examined on 11/08/2022  Brief hospital course: PMH of polysubstance abuse presented to the hospital with complaints of right leg pain. Currently being treated for Enterococcus bacteremia as well as right leg cellulitis. 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 Cardiology and cardiothoracic surgery (Dr. Leafy Ro) felt that the patient is not a surgical candidate.  Current plan is supportive care with IV antibiotics. Eventually need to be considered for debridement of right leg wound and reconsideration for CT surgery evaluation as he clinically improves and hopefully stays sober    Today, met patient sleeping, awakened easily. Denies any new complaints.   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 TTE shows evidence of preserved EF but torrential AI and severe MR with LV dilation. Cardiology was formally consulted, currently signed off Dr. Tereso Newcomer, reviewed the chart and recommended that the patient is not a surgical candidate for now due to ongoing heroin use Plan for reevaluation by CT surgery for consideration for surgical intervention Continue IV ampicillin, ceftriaxone  X ??total 6 weeks  Acute on chronic diastolic CHF Bilateral pleural effusion In the setting of severe MR and AI CT chest shows evidence of bilateral pleural effusion Management per cardiology, signed off Continue PO lasix   Right foot cellulitis No evidence of osteomyelitis/polymyositis/abscess/fasciitis MRI resulted in 11/2.  Reassuring for no evidence of osteomyelitis. Orthopedic rec local wound care/conservative  May require local  debridement once more stable  Polysubstance abuse Tobacco abuse Patient reports that he abuses fentanyl IV multiple times, daily He is UDS is positive for amphetamine, opiates, cocaine, cannabis.  (UDS performed after receiving opiates in the hospital) Patient is willing to quit  Continue Suboxone, clonidine, clonazepam, ativan as needed Continue BuSpar for anxiety Nicotine patch  Attempt for substance use in the hospital Patient was caught attempting to inject substance in the hospital on 10/31 Security was called in, multiple syringes and needles were identified in his personal belongings as well as different substance Visitation restricted Telemonitoring initiated   Hyponatremia Unclear etiology Possibly ?vol overload Daily CMP   Elevated LFT Coagulopathy- elevated INR Unclear etiology Bilirubin normal Possible from bacteremia S/p Vit K Daily CMP   Anemia of chronic disease Hemoglobin dropped down to 6.9. Requiring blood transfusion. Fibrinogen normal.  INR 1.5.  Reticulocyte count elevated. Transfuse for hemoglobin less than 8. Posttransfusion H&H is stable.     Thrombocytopenia Leukocytosis Likely 2/2 bacteremia For now monitor  Constipation Due to severe abdominal pain a CT abdomen was performed No acute abnormality seen other than some constipation. Continue bowel regimen.   Moderate protein calorie malnutrition  Nutrition Problem: Moderate Malnutrition Etiology: social / environmental circumstances Nutrition Interventions: Interventions: Refer to RD note for recommendations Body mass index is 20.51 kg/m.    Goals of care conversation Multiple discussion with the patient with regards to his goals of care per previous provider Dr Allena Katz Per CT surgery " per Dr. Leafy Ro, consulting CT surgeon: Ongoing heroin use and a chronic wound that would need to be dealt with way before he would ever be considered a surgical candidate and also would need to be off  heroine" Per cardiology recommended palliative care and DNR. Patient currently understands this  and has been transitioned to DNR DNI.  Mother also understands that           Physical Exam: General: NAD  Cardiovascular: S1, S2 present Respiratory: CTAB Abdomen: Soft, nontender, nondistended, bowel sounds present Musculoskeletal: No bilateral pedal edema noted Skin: Normal Psychiatry: Fair mood     Disposition: Status is: Inpatient Remains inpatient appropriate because: Need further workup and IV antibiotics.  enoxaparin (LOVENOX) injection 40 mg Start: 11/03/22 1000   Family Communication: No one at bedside  Level of care: Progressive   Vitals:   11/07/22 2033 11/08/22 0442 11/08/22 0447 11/08/22 1210  BP: (!) 122/48  (!) 122/50 (!) 111/52  Pulse: 97 96 98 97  Resp:    (!) 22  Temp:  97.8 F (36.6 C)  97.8 F (36.6 C)  TempSrc:  Oral  Oral  SpO2:   98% 99%  Weight:      Height:          Briant Cedar, MD 11/08/2022 5:46 PM  Please look on [email or web address] to find out who is on call.

## 2022-11-09 NOTE — Progress Notes (Signed)
[facility] Progress Note  Patient: Rick Taylor NWG:[id number] DOB: 06-17-1990 DOA: 10/30/2022  DOS: the patient was seen and examined on 11/09/2022  Brief hospital course: PMH of polysubstance abuse presented to the hospital with complaints of right leg pain. Currently being treated for Enterococcus bacteremia as well as right leg cellulitis. 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 Cardiology and cardiothoracic surgery (Dr. Leafy Ro) felt that the patient is not a surgical candidate.  Current plan is supportive care with IV antibiotics. Eventually need to be considered for debridement of right leg wound and reconsideration for CT surgery evaluation as he clinically improves and hopefully stays sober    Today, pt denies any new complaints.   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 TTE shows evidence of preserved EF but torrential AI and severe MR with LV dilation. Cardiology was formally consulted, currently signed off Dr. Tereso Newcomer, reviewed the chart and recommended that the patient is not a surgical candidate for now due to ongoing heroin use Plan for reevaluation by CT surgery for consideration for surgical intervention Continue IV ampicillin, ceftriaxone  X ??total 6 weeks  Acute on chronic diastolic CHF Bilateral pleural effusion In the setting of severe MR and AI CT chest shows evidence of bilateral pleural effusion Management per cardiology, signed off Continue PO lasix   Right foot cellulitis No evidence of osteomyelitis/polymyositis/abscess/fasciitis MRI resulted in 11/2.  Reassuring for no evidence of osteomyelitis. Orthopedic rec local wound care/conservative  May require local debridement once more  stable  Polysubstance abuse Tobacco abuse Patient reports that he abuses fentanyl IV multiple times, daily He is UDS is positive for amphetamine, opiates, cocaine, cannabis.  (UDS performed after receiving opiates in the hospital) Patient is willing to quit  Continue Suboxone, clonidine, clonazepam, ativan as needed Continue BuSpar for anxiety Nicotine patch  Attempt for substance use in the hospital Patient was caught attempting to inject substance in the hospital on 10/31 Security was called in, multiple syringes and needles were identified in his personal belongings as well as different substance Visitation restricted Telemonitoring initiated   Hyponatremia Unclear etiology Possibly ?vol overload Daily CMP   Elevated LFT Coagulopathy- elevated INR Unclear etiology Bilirubin normal Possible from bacteremia S/p Vit K Daily CMP   Anemia of chronic disease Hemoglobin dropped down to 6.9. Requiring blood transfusion. Fibrinogen normal.  INR 1.5.  Reticulocyte count elevated. Transfuse for hemoglobin less than 8. Posttransfusion H&H is stable.     Thrombocytopenia Leukocytosis Likely 2/2 bacteremia For now monitor  Constipation Due to severe abdominal pain a CT abdomen was performed No acute abnormality seen other than some constipation. Continue bowel regimen.   Moderate protein calorie malnutrition  Nutrition Problem: Moderate Malnutrition Etiology: social / environmental circumstances Nutrition Interventions: Interventions: Refer to RD note for recommendations Body mass index is 20.51 kg/m.    Goals of care conversation Multiple discussion with the patient with regards to his goals of care per previous provider Dr Allena Katz Per CT surgery " per Dr. Leafy Ro, consulting CT surgeon: Ongoing heroin use and a chronic wound that would need to be dealt with way before he would ever be considered a surgical candidate and also would need to be off heroine" Per cardiology  recommended palliative care and DNR. Patient currently understands this and has been transitioned  to DNR DNI.  Mother also understands that           Physical Exam: General: NAD  Cardiovascular: S1, S2 present Respiratory: CTAB Abdomen: Soft, nontender, nondistended, bowel sounds present Musculoskeletal: No bilateral pedal edema noted Skin: Normal Psychiatry: Fair mood     Disposition: Status is: Inpatient Remains inpatient appropriate because: Need further workup and IV antibiotics.  enoxaparin (LOVENOX) injection 40 mg Start: 11/03/22 1000   Family Communication: No one at bedside  Level of care: Progressive   Vitals:   11/08/22 1210 11/08/22 1930 11/09/22 0502 11/09/22 1100  BP: (!) 111/52 (!) 113/48 (!) 115/43 (!) 120/58  Pulse: 97 (!) 102 88 86  Resp: (!) 22 18 18    Temp: 97.8 F (36.6 C) 97.7 F (36.5 C) 98.8 F (37.1 C) 98 F (36.7 C)  TempSrc: Oral Oral Oral Oral  SpO2: 99% 98% 100% 100%  Weight:      Height:          Rick Cedar, MD 11/09/2022 2:41 PM  Please look on [email or web address] to find out who is on call.

## 2022-11-09 NOTE — Progress Notes (Signed)
Mobility Specialist - Progress Note   11/09/22 1513  Mobility  Activity Ambulated independently in hallway  Level of Assistance Standby assist, set-up cues, supervision of patient - no hands on  Assistive Device Other (Comment) (Hallway Rail)  Distance Ambulated (ft) 350 ft  Range of Motion/Exercises Active  RLE Weight Bearing WBAT  Activity Response Tolerated well  Mobility Referral Yes  $Mobility charge 1 Mobility  Mobility Specialist Start Time (ACUTE ONLY) 1500  Mobility Specialist Stop Time (ACUTE ONLY) 1513  Mobility Specialist Time Calculation (min) (ACUTE ONLY) 13 min   Pt was found in bed and agreeable to ambulate after some encouragement. C/o constant R foot pain during session. At EOS returned to bed with all needs met. Call bell in reach and RN notified.  Billey Chang Mobility Specialist

## 2022-11-09 NOTE — Progress Notes (Signed)
[facility]   Reason for visit: Follow up on E faecalis bacteremia with AV endocarditis  Interval History: no plan for surgery noted; repeat blood cultures ngtd from 11/1, now final   Physical Exam: Constitutional:  Vitals:   11/09/22 0502 11/09/22 1100  BP: (!) 115/43 (!) 120/58  Pulse: 88 86  Resp: 18   Temp: 98.8 F (37.1 C) 98 F (36.7 C)  SpO2: 100% 100%   patient appears in NAD   Review of Systems: Constitutional: negative for fevers and chills  Lab Results  Component Value Date   WBC 15.1 (H) 11/08/2022   HGB 9.5 (L) 11/08/2022   HCT 31.9 (L) 11/08/2022   MCV 82.0 11/08/2022   PLT 424 (H) 11/08/2022    Lab Results  Component Value Date   CREATININE 0.61 11/08/2022   BUN 12 11/08/2022   NA 128 (L) 11/08/2022   K 4.3 11/08/2022   CL 95 (L) 11/08/2022   CO2 22 11/08/2022    Lab Results  Component Value Date   ALT 49 (H) 11/08/2022   AST 35 11/08/2022   ALKPHOS 101 11/08/2022     Microbiology: Recent Results (from the past 240 hour(s))  Blood culture (routine x 2)     Status: Abnormal   Collection Time: 10/31/22  4:05 AM   Specimen: BLOOD RIGHT FOREARM  Result Value Ref Range Status   Specimen Description   Final    BLOOD RIGHT FOREARM Performed at [facility], [address], [place], [place] [address]    Special Requests   Final    BOTTLES DRAWN AEROBIC AND ANAEROBIC Blood Culture adequate volume Performed at [facility], [address] [place], [place] [address]    Culture  Setup Time   Final    GRAM POSITIVE COCCI IN PAIRS IN BOTH AEROBIC AND ANAEROBIC BOTTLES CRITICAL RESULT CALLED TO, READ BACK BY AND VERIFIED WITH: PHARMD C. AHMEND [id number] @ 1749 FH    Culture (A)  Final    ENTEROCOCCUS FAECALIS SUSCEPTIBILITIES PERFORMED ON PREVIOUS CULTURE WITHIN THE LAST 5 DAYS. Performed at [facility], [address], [place], [place] [address]    Report Status 11/02/2022 FINAL   Final  Blood culture (routine x 2)     Status: Abnormal   Collection Time: 10/31/22  4:05 AM   Specimen: BLOOD LEFT FOREARM  Result Value Ref Range Status   Specimen Description   Final    BLOOD LEFT FOREARM Performed at [facility], [address] [place], [place] [address]    Special Requests   Final    BOTTLES DRAWN AEROBIC AND ANAEROBIC Blood Culture adequate volume Performed at [facility], [address], [place], [place] [address]    Culture  Setup Time   Final    GRAM POSITIVE COCCI IN BOTH AEROBIC AND ANAEROBIC BOTTLES PHARMD A. [facility] [id number] @ 1745 FH Performed at [facility], [address], [place], [place] [address]    Culture ENTEROCOCCUS FAECALIS (A)  Final   Report Status 11/02/2022 FINAL  Final   Organism ID, Bacteria ENTEROCOCCUS FAECALIS  Final      Susceptibility   Enterococcus faecalis - MIC*    AMPICILLIN <=2 SENSITIVE Sensitive     VANCOMYCIN 1 SENSITIVE Sensitive     GENTAMICIN SYNERGY SENSITIVE Sensitive     * ENTEROCOCCUS FAECALIS  Blood Culture ID Panel (Reflexed)     Status: Abnormal  Collection Time: 10/31/22  4:05 AM  Result Value Ref Range Status   Enterococcus faecalis DETECTED (A) NOT DETECTED Final    Comment: CRITICAL RESULT CALLED TO, READ BACK BY AND VERIFIED WITH: PHARMD A. PHAM [id number] @ 1745 FH    Enterococcus Faecium NOT DETECTED NOT DETECTED Final   Listeria monocytogenes NOT DETECTED NOT DETECTED Final   Staphylococcus species NOT DETECTED NOT DETECTED Final   Staphylococcus aureus (BCID) NOT DETECTED NOT DETECTED Final   Staphylococcus epidermidis NOT DETECTED NOT DETECTED Final   Staphylococcus lugdunensis NOT DETECTED NOT DETECTED Final   Streptococcus species NOT DETECTED NOT DETECTED Final   Streptococcus agalactiae NOT DETECTED NOT DETECTED Final   Streptococcus pneumoniae NOT DETECTED NOT DETECTED Final   Streptococcus pyogenes NOT DETECTED NOT DETECTED Final   A.calcoaceticus-baumannii NOT  DETECTED NOT DETECTED Final   Bacteroides fragilis NOT DETECTED NOT DETECTED Final   Enterobacterales NOT DETECTED NOT DETECTED Final   Enterobacter cloacae complex NOT DETECTED NOT DETECTED Final   Escherichia coli NOT DETECTED NOT DETECTED Final   Klebsiella aerogenes NOT DETECTED NOT DETECTED Final   Klebsiella oxytoca NOT DETECTED NOT DETECTED Final   Klebsiella pneumoniae NOT DETECTED NOT DETECTED Final   Proteus species NOT DETECTED NOT DETECTED Final   Salmonella species NOT DETECTED NOT DETECTED Final   Serratia marcescens NOT DETECTED NOT DETECTED Final   Haemophilus influenzae NOT DETECTED NOT DETECTED Final   Neisseria meningitidis NOT DETECTED NOT DETECTED Final   Pseudomonas aeruginosa NOT DETECTED NOT DETECTED Final   Stenotrophomonas maltophilia NOT DETECTED NOT DETECTED Final   Candida albicans NOT DETECTED NOT DETECTED Final   Candida auris NOT DETECTED NOT DETECTED Final   Candida glabrata NOT DETECTED NOT DETECTED Final   Candida krusei NOT DETECTED NOT DETECTED Final   Candida parapsilosis NOT DETECTED NOT DETECTED Final   Candida tropicalis NOT DETECTED NOT DETECTED Final   Cryptococcus neoformans/gattii NOT DETECTED NOT DETECTED Final   Vancomycin resistance NOT DETECTED NOT DETECTED Final    Comment: Performed at [facility], [address], [place], [place] [address]  MRSA Next Gen by PCR, Nasal     Status: Abnormal   Collection Time: 11/01/22 12:25 PM   Specimen: Nasal Mucosa; Nasal Swab  Result Value Ref Range Status   MRSA by PCR Next Gen DETECTED (A) NOT DETECTED Final    Comment: RESULT CALLED TO, READ BACK BY AND VERIFIED WITH: PECHETTE, F RN @ 1859 ON 11/01/2022 BY ABDULHALIM,M (NOTE) The GeneXpert MRSA Assay (FDA approved for NASAL specimens only), is one component of a comprehensive MRSA colonization surveillance program. It is not intended to diagnose MRSA infection nor to guide or monitor treatment for MRSA infections. Test performance is  not FDA approved in patients less than 57 years old. Performed at [facility], [address] [place], [place] [address]   Culture, blood (Routine X 2) w Reflex to ID Panel     Status: None   Collection Time: 11/02/22  5:46 AM   Specimen: BLOOD  Result Value Ref Range Status   Specimen Description   Final    BLOOD BLOOD LEFT ARM Performed at [facility], [address] [place], [place] [address]    Special Requests   Final    BOTTLES DRAWN AEROBIC AND ANAEROBIC Blood Culture adequate volume Performed at [facility], [address], [place], [place] [address]    Culture   Final    NO GROWTH 5 DAYS  Performed at [facility], [address], [place], [place] [address]    Report Status 11/07/2022 FINAL  Final  Culture, blood (Routine X 2) w Reflex to ID Panel     Status: None   Collection Time: 11/02/22  5:46 AM   Specimen: BLOOD  Result Value Ref Range Status   Specimen Description   Final    BLOOD BLOOD LEFT HAND Performed at [facility], [address], [place], [place] [address]    Special Requests   Final    BOTTLES DRAWN AEROBIC AND ANAEROBIC Blood Culture results may not be optimal due to an inadequate volume of blood received in culture bottles Performed at [facility], [address] [place], [place] [address]    Culture   Final    NO GROWTH 5 DAYS Performed at [facility], [address] [place], [place] [address]    Report Status 11/07/2022 FINAL  Final    Impression/Plan:  1. Bacteremia with endocarditis - recent notes reviewed and no plan for surgery.  Bacteremia clearing with no new positive cultures.   Tolerating antibiotics and no changes indicated.   Dr. Drue Second available over the weekend if needed.

## 2022-11-09 NOTE — Plan of Care (Signed)

## 2022-11-10 MED ORDER — ORAL CARE MOUTH RINSE: 15.0000 mL | OROMUCOSAL | Status: DC | PRN

## 2022-11-10 NOTE — Plan of Care (Signed)
  Problem: Activity: Goal: Risk for activity intolerance will decrease Outcome: Progressing   Problem: Coping: Goal: Level of anxiety will decrease Outcome: Progressing   Problem: Elimination: Goal: Will not experience complications related to bowel motility Outcome: Progressing   Problem: Pain Management: Goal: General experience of comfort will improve Outcome: Progressing   Problem: Safety: Goal: Ability to remain free from injury will improve Outcome: Progressing

## 2022-11-10 NOTE — Progress Notes (Signed)
[facility] Progress Note  Patient: Rick Taylor NWG:[id number] DOB: 1990-12-03 DOA: 10/30/2022  DOS: the patient was seen and examined on 11/10/2022  Brief hospital course: PMH of polysubstance abuse presented to the hospital with complaints of right leg pain. Currently being treated for Enterococcus bacteremia as well as right leg cellulitis. 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 Cardiology and cardiothoracic surgery (Dr. Leafy Ro) felt that the patient is not a surgical candidate.  Current plan is supportive care with IV antibiotics. 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 TTE shows evidence of preserved EF but torrential AI and severe MR with LV dilation. Cardiology was formally consulted, currently signed off Dr. Tereso Newcomer, reviewed the chart and recommended that the patient is not a surgical candidate for now due to ongoing heroin use Plan for reevaluation by CT surgery for consideration for surgical intervention Continue IV ampicillin, ceftriaxone  X ??total 6 weeks  Acute on chronic diastolic CHF Bilateral pleural effusion In the setting of severe MR and AI CT chest shows evidence of bilateral pleural effusion Management per cardiology, signed off Continue PO lasix   Right foot cellulitis No evidence of osteomyelitis/polymyositis/abscess/fasciitis MRI resulted in 11/2.  Reassuring for no evidence of osteomyelitis. Orthopedic rec local wound care/conservative  May require local debridement once more stable  Polysubstance  abuse Tobacco abuse Patient reports that he abuses fentanyl IV multiple times, daily He is UDS is positive for amphetamine, opiates, cocaine, cannabis.  (UDS performed after receiving opiates in the hospital) Patient is willing to quit  Continue Suboxone, clonidine, clonazepam, ativan as needed Continue BuSpar for anxiety Nicotine patch  Attempt for substance use in the hospital Patient was caught attempting to inject substance in the hospital on 10/31 Security was called in, multiple syringes and needles were identified in his personal belongings as well as different substance Visitation restricted Telemonitoring initiated   Hyponatremia Unclear etiology Possibly ?vol overload Daily CMP   Elevated LFT Coagulopathy- elevated INR Unclear etiology Bilirubin normal Possible from bacteremia S/p Vit K Daily CMP   Anemia of chronic disease Hemoglobin dropped down to 6.9. Requiring blood transfusion. Fibrinogen normal.  INR 1.5.  Reticulocyte count elevated. Transfuse for hemoglobin less than 8. Posttransfusion H&H is stable.     Thrombocytopenia Leukocytosis Likely 2/2 bacteremia For now monitor  Constipation Due to severe abdominal pain a CT abdomen was performed No acute abnormality seen other than some constipation. Continue bowel regimen.   Moderate protein calorie malnutrition  Nutrition Problem: Moderate Malnutrition Etiology: social / environmental circumstances Nutrition Interventions: Interventions: Refer to RD note for recommendations Body mass index is 20.51 kg/m.    Goals of care conversation Multiple discussion with the patient with regards to his goals of care per previous provider Dr Allena Katz Per CT surgery " per Dr. Leafy Ro, consulting CT surgeon: Ongoing heroin use and a chronic wound that would need to be dealt with way before he would ever be considered a surgical candidate and also would need to be off heroine" Per cardiology recommended palliative  care and DNR. Patient currently understands this and has been transitioned to DNR  DNI.  Mother also understands that           Physical Exam: General: NAD  Cardiovascular: S1, S2 present Respiratory: CTAB Abdomen: Soft, nontender, nondistended, bowel sounds present Musculoskeletal: No bilateral pedal edema noted Skin: Normal Psychiatry: Fair mood     Disposition: Status is: Inpatient Remains inpatient appropriate because: Need further workup and IV antibiotics.  enoxaparin (LOVENOX) injection 40 mg Start: 11/03/22 1000   Family Communication: No one at bedside  Level of care: Progressive   Vitals:   11/09/22 1100 11/09/22 1943 11/10/22 0623 11/10/22 1201  BP: (!) 120/58 (!) 123/55 (!) 150/56 (!) 107/42  Pulse: 86 99 (!) 105 96  Resp:  20 18 18   Temp: 98 F (36.7 C) 98.3 F (36.8 C) 98.2 F (36.8 C) 98.4 F (36.9 C)  TempSrc: Oral Oral  Oral  SpO2: 100% 99% 99% 100%  Weight:      Height:          Briant Cedar, MD 11/10/2022 4:44 PM  Please look on [email or web address] to find out who is on call.

## 2022-11-10 NOTE — Progress Notes (Signed)
Mobility Specialist - Progress Note  During mobility: 112 bpm HR,  Post-mobility: 106 bpm HR,    11/10/22 1336  Mobility  Activity Ambulated with assistance in hallway  Level of Assistance Modified independent, requires aide device or extra time  Assistive Device Front wheel walker  Distance Ambulated (ft) 350 ft  Range of Motion/Exercises Active  RLE Weight Bearing WBAT  Activity Response Tolerated well  Mobility Referral Yes  $Mobility charge 1 Mobility  Mobility Specialist Start Time (ACUTE ONLY) 1315  Mobility Specialist Stop Time (ACUTE ONLY) 1336  Mobility Specialist Time Calculation (min) (ACUTE ONLY) 21 min   Pt was found in bed and agreeable to ambulate. C/o worse foot pain compared to yesterday. Rated pain 9/10. At EOS returned to bed with all needs met. Call bell in reach.  Billey Chang Mobility Specialist

## 2022-11-11 LAB — BASIC METABOLIC PANEL
Anion gap: 7 (ref 5–15)
BUN: 9 mg/dL (ref 6–20)
CO2: 24 mmol/L (ref 22–32)
Calcium: 7.9 mg/dL — ABNORMAL LOW (ref 8.9–10.3)
Chloride: 99 mmol/L (ref 98–111)
Creatinine, Ser: 0.55 mg/dL — ABNORMAL LOW (ref 0.61–1.24)
GFR, Estimated: >60 mL/min (ref >60)
Glucose, Bld: 104 mg/dL — ABNORMAL HIGH (ref 70–99)
Potassium: 3.3 mmol/L — ABNORMAL LOW (ref 3.5–5.1)
Sodium: 130 mmol/L — ABNORMAL LOW (ref 135–145)

## 2022-11-11 LAB — CBC WITH DIFFERENTIAL/PLATELET
Abs Immature Granulocytes: 0.07 10*3/uL (ref 0.00–0.07)
Basophils Absolute: 0.1 10*3/uL (ref 0.0–0.1)
Basophils Relative: 1 %
Eosinophils Absolute: 0.1 10*3/uL (ref 0.0–0.5)
Eosinophils Relative: 0 %
HCT: 27.1 % — ABNORMAL LOW (ref 39.0–52.0)
Hemoglobin: 8.4 g/dL — ABNORMAL LOW (ref 13.0–17.0)
Immature Granulocytes: 1 %
Lymphocytes Relative: 8 %
Lymphs Abs: 1.2 10*3/uL (ref 0.7–4.0)
MCH: 25.5 pg — ABNORMAL LOW (ref 26.0–34.0)
MCHC: 31 g/dL (ref 30.0–36.0)
MCV: 82.1 fL (ref 80.0–100.0)
Monocytes Absolute: 0.7 10*3/uL (ref 0.1–1.0)
Monocytes Relative: 5 %
Neutro Abs: 12.1 10*3/uL — ABNORMAL HIGH (ref 1.7–7.7)
Neutrophils Relative %: 85 %
Platelets: 397 10*3/uL (ref 150–400)
RBC: 3.3 MIL/uL — ABNORMAL LOW (ref 4.22–5.81)
RDW: 21.3 % — ABNORMAL HIGH (ref 11.5–15.5)
WBC: 14.1 10*3/uL — ABNORMAL HIGH (ref 4.0–10.5)
nRBC: 0 % (ref 0.0–0.2)

## 2022-11-11 LAB — MAGNESIUM: Magnesium: 2 mg/dL (ref 1.7–2.4)

## 2022-11-11 MED ORDER — LORATADINE 10 MG PO TABS
10.0000 mg | ORAL_TABLET | Freq: Every day | ORAL | Status: DC
  Administered 2022-11-11 – 2022-11-17 (×7): 10 mg via ORAL
  Filled 2022-11-11 (×7): qty 1

## 2022-11-11 MED ORDER — TRAZODONE HCL 50 MG PO TABS
50.0000 mg | ORAL_TABLET | Freq: Every evening | ORAL | Status: DC | PRN
  Administered 2022-11-11 – 2022-11-17 (×4): 50 mg via ORAL
  Filled 2022-11-11 (×4): qty 1

## 2022-11-11 MED ORDER — CAMPHOR-MENTHOL 0.5-0.5 % EX LOTN
TOPICAL_LOTION | CUTANEOUS | Status: DC | PRN
  Filled 2022-11-11: qty 222

## 2022-11-11 MED ORDER — PANTOPRAZOLE SODIUM 40 MG PO TBEC
40.0000 mg | DELAYED_RELEASE_TABLET | Freq: Every day | ORAL | Status: DC
  Administered 2022-11-11 – 2022-11-17 (×7): 40 mg via ORAL
  Filled 2022-11-11 (×7): qty 1

## 2022-11-11 MED ORDER — VANCOMYCIN HCL IN DEXTROSE 1-5 GM/200ML-% IV SOLN
1000.0000 mg | Freq: Three times a day (TID) | INTRAVENOUS | Status: DC
  Administered 2022-11-11 – 2022-11-13 (×5): 1000 mg via INTRAVENOUS
  Filled 2022-11-11 (×5): qty 200

## 2022-11-11 MED ORDER — POTASSIUM CHLORIDE CRYS ER 20 MEQ PO TBCR
40.0000 meq | EXTENDED_RELEASE_TABLET | Freq: Two times a day (BID) | ORAL | Status: AC
  Administered 2022-11-11 (×2): 40 meq via ORAL
  Filled 2022-11-11 (×2): qty 2

## 2022-11-11 MED ORDER — VANCOMYCIN HCL 1500 MG/300ML IV SOLN
1500.0000 mg | INTRAVENOUS | Status: AC
  Administered 2022-11-11: 1500 mg via INTRAVENOUS
  Filled 2022-11-11: qty 300

## 2022-11-11 NOTE — Plan of Care (Signed)
  Problem: Education: Goal: Knowledge of General Education information will improve Description: Including pain rating scale, medication(s)/side effects and non-pharmacologic comfort measures Outcome: Progressing   Problem: Clinical Measurements: Goal: Will remain free from infection Outcome: Not Progressing   Problem: Coping: Goal: Level of anxiety will decrease Outcome: Not Progressing   Problem: Pain Management: Goal: General experience of comfort will improve Outcome: Not Progressing

## 2022-11-11 NOTE — Progress Notes (Signed)
Pharmacy Antibiotic Note  Rick Taylor is a 32 y.o. male admitted on 10/30/2022 with E. faecalis bacteremia with AV endocarditis. Patient with new diffuse maculopapular rash. ID evaluated and changing antibiotics to Vancomycin. Pharmacy has been consulted for Vancomycin dosing.  Plan: Vancomycin 1500mg  IV x 1, then 1g IV q8h Vancomycin levels at steady state Monitor renal function, cultures, clinical course, further ID recommendations   Height: 6\' 1"  (185.4 cm) Weight: 70.5 kg (155 lb 6.8 oz) IBW/kg (Calculated) : 79.9  Temp (24hrs), Avg:98 F (36.7 C), Min:97.9 F (36.6 C), Max:98 F (36.7 C)  Recent Labs  Lab 11/05/22 0337 11/06/22 1121 11/07/22 0356 11/08/22 0402 11/11/22 1023  WBC 16.8* 17.7* 16.4* 15.1* 14.1*  CREATININE 0.68 0.56* 0.61 0.61 0.55*    Estimated Creatinine Clearance: 131 mL/min (A) (by C-G formula based on SCr of 0.55 mg/dL (L)).    No Known Allergies  Antimicrobials this admission: 10/30 Cefepime x 1 10/30 Metronidazole x 1 10/30 Ceftriaxone x 1, resume 11/1 >> 11/10 10/30 Vancomycin >> 10/31, resume 11/10 >> 10/30 Unasyn >> 10/31  10/31 Ampicillin >> 11/10 11/3 Fluconazole >> (11/12)  Microbiology results: 10/30 BCx: 4/4 E. faecalis (pan-sensitive) 10/31 MRSA PCR: detected  11/1 repeat BCx: NGF  Thank you for allowing pharmacy to be a part of this patient's care.  Greer Pickerel, PharmD, BCPS Clinical Pharmacist 11/11/2022 1:20 PM

## 2022-11-11 NOTE — Progress Notes (Signed)
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 Started to have diffuse maculpapular rash  Pictures reviewed. Appears c/w drug rash  Recommend to stop ampicillin and ceftriaxone Will start on vancomycin per pharmacy Anticipate to take 5-10 days for rash to improve If pruritic, would start atarax and zyrtec  Trudee Chirino B. Drue Second MD MPH [facility] [phone]

## 2022-11-11 NOTE — Plan of Care (Signed)
  Problem: Education: Goal: Knowledge of General Education information will improve Description: Including pain rating scale, medication(s)/side effects and non-pharmacologic comfort measures Outcome: Progressing   Problem: Health Behavior/Discharge Planning: Goal: Ability to manage health-related needs will improve Outcome: Progressing   Problem: Clinical Measurements: Goal: Ability to maintain clinical measurements within normal limits will improve Outcome: Progressing Goal: Will remain free from infection Outcome: Progressing Goal: Diagnostic test results will improve Outcome: Progressing Goal: Cardiovascular complication will be avoided Outcome: Progressing   Problem: Activity: Goal: Risk for activity intolerance will decrease Outcome: Progressing   Problem: Coping: Goal: Level of anxiety will decrease Outcome: Progressing   Problem: Elimination: Goal: Will not experience complications related to bowel motility Outcome: Progressing   Problem: Pain Management: Goal: General experience of comfort will improve Outcome: Progressing   Problem: Safety: Goal: Ability to remain free from injury will improve Outcome: Progressing   Problem: Skin Integrity: Goal: Risk for impaired skin integrity will decrease Outcome: Progressing   Problem: Clinical Measurements: Goal: Respiratory complications will improve Outcome: Adequate for Discharge   Problem: Nutrition: Goal: Adequate nutrition will be maintained Outcome: Adequate for Discharge   Problem: Elimination: Goal: Will not experience complications related to urinary retention Outcome: Adequate for Discharge

## 2022-11-11 NOTE — Progress Notes (Signed)
[facility] Progress Note  Patient: Rick Taylor ZOX:[id number] DOB: 23-Jul-1990 DOA: 10/30/2022  DOS: the patient was seen and examined on 11/11/2022  Brief hospital course: PMH of polysubstance abuse presented to the hospital with complaints of right leg pain. Currently being treated for Enterococcus bacteremia as well as right leg cellulitis. 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 Cardiology and cardiothoracic surgery (Dr. Leafy Ro) felt that the patient is not a surgical candidate.  Current plan is supportive care with IV antibiotics. Eventually need to be considered for debridement of right leg wound and reconsideration for CT surgery evaluation as he clinically improves and hopefully stays sober    Pt noted to have a widespread diffuse rash around torso. Denies any pruritus, SOB, chest pain/tightness, fever/chills.    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 TTE shows evidence of preserved EF but torrential AI and severe MR with LV dilation. Cardiology was formally consulted, currently signed off Dr. Tereso Newcomer, reviewed the chart and recommended that the patient is not a surgical candidate for now due to ongoing heroin use Plan for reevaluation by CT surgery for consideration for surgical intervention Due to likely drug rash, IV ampicillin and ceftriaxone were discontinued on 11/10 per ID, recommend switching to IV Vancomycin  Likely drug rash Noted widespread diffuse rash around torso, non pruritic  Discussed with ID, stop ceftriaxone/ampicillin as noted above Atarax prn, claritin for itching   Acute on chronic diastolic CHF Bilateral pleural effusion In the setting of severe MR and AI CT chest shows evidence of bilateral  pleural effusion Management per cardiology, signed off Continue PO lasix  Hypokalemia Replace prn   Right foot cellulitis No evidence of osteomyelitis/polymyositis/abscess/fasciitis MRI resulted in 11/2.  Reassuring for no evidence of osteomyelitis. Orthopedic rec local wound care/conservative  May require local debridement once more stable  Polysubstance abuse Tobacco abuse Patient reports that he abuses fentanyl IV multiple times, daily He is UDS is positive for amphetamine, opiates, cocaine, cannabis.  (UDS performed after receiving opiates in the hospital) Patient is willing to quit  Continue Suboxone, clonidine, clonazepam, ativan as needed Continue BuSpar for anxiety Nicotine patch  Attempt for substance use in the hospital Patient was caught attempting to inject substance in the hospital on 10/31 Security was called in, multiple syringes and needles were identified in his personal belongings as well as different substance Visitation restricted Telemonitoring initiated   Hyponatremia Unclear etiology Possibly ?vol overload Daily CMP   Elevated LFT Coagulopathy- elevated INR Unclear etiology Bilirubin normal Possible from bacteremia S/p Vit K Daily CMP   Anemia of chronic disease Hemoglobin dropped down to 6.9. Requiring blood transfusion. Fibrinogen normal.  INR 1.5.  Reticulocyte count elevated. Transfuse for hemoglobin less than 8. Posttransfusion H&H is stable.     Thrombocytopenia Leukocytosis Likely 2/2 bacteremia For now monitor  Constipation Due to severe abdominal pain a CT abdomen was performed No acute abnormality seen other than some constipation. Continue bowel regimen.   Moderate protein calorie malnutrition  Nutrition Problem: Moderate Malnutrition Etiology: social / environmental circumstances Nutrition Interventions: Interventions: Refer to RD note for recommendations Body mass index is 20.51 kg/m.    Goals of care  conversation Multiple discussion with the patient with regards to his goals of care per previous provider Dr Allena Katz Per CT surgery "  per Dr. Leafy Ro, consulting CT surgeon: Ongoing heroin use and a chronic wound that would need to be dealt with way before he would ever be considered a surgical candidate and also would need to be off heroine" Per cardiology recommended palliative care and DNR. Patient currently understands this and has been transitioned to DNR DNI.  Mother also understands that           Physical Exam: General: NAD  Cardiovascular: S1, S2 present Respiratory: CTAB Abdomen: Soft, nontender, nondistended, bowel sounds present Musculoskeletal: No bilateral pedal edema noted Skin: Widespread torso rash noted Psychiatry: Fair mood     Disposition: Status is: Inpatient Remains inpatient appropriate because: Need further workup and IV antibiotics.  enoxaparin (LOVENOX) injection 40 mg Start: 11/03/22 1000   Family Communication: No one at bedside  Level of care: Progressive   Vitals:   11/10/22 0623 11/10/22 1201 11/10/22 2137 11/11/22 0406  BP: (!) 150/56 (!) 107/42 (!) 113/53 (!) 110/59  Pulse: (!) 105 96 (!) 110 (!) 110  Resp: 18 18 20 20   Temp: 98.2 F (36.8 C) 98.4 F (36.9 C) 97.9 F (36.6 C) 98 F (36.7 C)  TempSrc:  Oral Oral Oral  SpO2: 99% 100% 100% 99%  Weight:      Height:          Briant Cedar, MD 11/11/2022 1:52 PM  Please look on [email or web address] to find out who is on call.

## 2022-11-12 LAB — RPR: RPR Ser Ql: NONREACTIVE

## 2022-11-12 MED ORDER — GENTAMICIN SULFATE 40 MG/ML IJ SOLN
100.0000 mg | Freq: Two times a day (BID) | INTRAVENOUS | Status: DC
  Administered 2022-11-12 – 2022-11-14 (×4): 100 mg via INTRAVENOUS
  Filled 2022-11-12 (×5): qty 2.5

## 2022-11-12 MED ORDER — GENTAMICIN IN SALINE 1-0.9 MG/ML-% IV SOLN
100.0000 mg | Freq: Two times a day (BID) | INTRAVENOUS | Status: DC
  Filled 2022-11-12: qty 100

## 2022-11-12 NOTE — Progress Notes (Signed)
Pharmacy Antibiotic Note  Rick Rolnick is a 32 y.o. 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 The patient developed a diffuse maculopapular rash over the weekend and was transitioned to Vancomycin, with plans now to add Gentamicin for synergy.     SCr 0.55 - stable.  TBW < IBW so will utilize TBW for calculations. Goal Gent peak 3-4 mcg/ml, trough <1 mcg/ml.   Plan: - Continue Vancomycin 1g IV every 8 hours - Start Gentamicin 100 mg IV every 12 hours - Will continue to follow renal function, culture results, LOT, and antibiotic de-escalation plans   Height: 6\' 1"  (185.4 cm) Weight: 70.5 kg (155 lb 6.8 oz) IBW/kg (Calculated) : 79.9  Temp (24hrs), Avg:98.6 F (37 C), Min:98.5 F (36.9 C), Max:98.7 F (37.1 C)  Recent Labs  Lab 11/06/22 1121 11/07/22 0356 11/08/22 0402 11/11/22 1023  WBC 17.7* 16.4* 15.1* 14.1*  CREATININE 0.56* 0.61 0.61 0.55*    Estimated Creatinine Clearance: 131 mL/min (A) (by C-G formula based on SCr of 0.55 mg/dL (L)).    Allergies  Allergen Reactions   Ampicillin Rash    diffuse maculopapular rash - added to allergy list per Dr. Drue Second   Ceftriaxone Rash    diffuse maculopapular rash - added to allergy list per Dr. Drue Second    Antimicrobials this admission: Vancomycin 11/10 >> Gentamicin 11/11 >>  Dose adjustments this admission:   Microbiology results: 10/30 BCx >> 4/4 E faecalis (pan-sensitive) 11/1 BCx >> NgF  Thank you for allowing pharmacy to be a part of this patient's care.  Georgina Pillion, PharmD, BCPS, BCIDP Infectious Diseases Clinical Pharmacist 11/12/2022 2:39 PM   **Pharmacist phone directory can now be found on [email or web address] ([email or web address]).  Listed under [facility].

## 2022-11-12 NOTE — Plan of Care (Signed)
  Problem: Education: Goal: Knowledge of General Education information will improve Description: Including pain rating scale, medication(s)/side effects and non-pharmacologic comfort measures Outcome: Progressing   Problem: Health Behavior/Discharge Planning: Goal: Ability to manage health-related needs will improve Outcome: Progressing   Problem: Clinical Measurements: Goal: Ability to maintain clinical measurements within normal limits will improve Outcome: Progressing Goal: Will remain free from infection Outcome: Progressing Goal: Diagnostic test results will improve Outcome: Progressing Goal: Cardiovascular complication will be avoided Outcome: Progressing   Problem: Activity: Goal: Risk for activity intolerance will decrease Outcome: Progressing   Problem: Coping: Goal: Level of anxiety will decrease Outcome: Progressing   Problem: Elimination: Goal: Will not experience complications related to bowel motility Outcome: Progressing Goal: Will not experience complications related to urinary retention Outcome: Progressing   Problem: Pain Management: Goal: General experience of comfort will improve Outcome: Progressing   Problem: Safety: Goal: Ability to remain free from injury will improve Outcome: Progressing   Problem: Skin Integrity: Goal: Risk for impaired skin integrity will decrease Outcome: Progressing   Problem: Education: Goal: Ability to state activities that reduce stress will improve Outcome: Progressing   Problem: Coping: Goal: Ability to identify and develop effective coping behavior will improve Outcome: Progressing   Problem: Self-Concept: Goal: Ability to identify factors that promote anxiety will improve Outcome: Progressing Goal: Level of anxiety will decrease Outcome: Progressing Goal: Ability to modify response to factors that promote anxiety will improve Outcome: Progressing   Problem: Clinical Measurements: Goal: Respiratory  complications will improve Outcome: Adequate for Discharge   Problem: Nutrition: Goal: Adequate nutrition will be maintained Outcome: Adequate for Discharge

## 2022-11-12 NOTE — TOC Progression Note (Signed)
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 TOC will continue to follow for dc planing.       Expected Discharge Plan: Home/Self Care Barriers to Discharge: Continued Medical Work up  Expected Discharge Plan and Services   Discharge Planning Services: CM Consult   Living arrangements for the past 2 months: Homeless Shelter                                       Social Determinants of Health (SDOH) Interventions SDOH Screenings   Food Insecurity: Food Insecurity Present (11/01/2022)  Housing: High Risk (11/01/2022)  Transportation Needs: Unmet Transportation Needs (11/01/2022)  Utilities: Not At Risk (11/01/2022)  Tobacco Use: High Risk (11/02/2022)    Readmission Risk Interventions    11/01/2022    3:59 PM  Readmission Risk Prevention Plan  Transportation Screening Complete  PCP or Specialist Appt within 5-7 Days Complete  Home Care Screening Complete  Medication Review (RN CM) Complete

## 2022-11-12 NOTE — Plan of Care (Signed)
  Problem: Education: Goal: Knowledge of General Education information will improve Description: Including pain rating scale, medication(s)/side effects and non-pharmacologic comfort measures Outcome: Progressing   Problem: Clinical Measurements: Goal: Respiratory complications will improve Outcome: Progressing   Problem: Nutrition: Goal: Adequate nutrition will be maintained Outcome: Progressing   Problem: Coping: Goal: Level of anxiety will decrease Outcome: Not Progressing   Problem: Pain Management: Goal: General experience of comfort will improve Outcome: Not Progressing   Problem: Skin Integrity: Goal: Risk for impaired skin integrity will decrease Outcome: Not Progressing

## 2022-11-12 NOTE — Progress Notes (Signed)
Patient refused dressing change. He stated he wanted to go to sleep and do it at 10 in the morning.

## 2022-11-12 NOTE — Progress Notes (Signed)
Pharmacy Antibiotic Note  Rick Taylor is a 32 y.o. 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 The patient developed a diffuse maculopapular rash over the weekend and was transitioned to Vancomycin, with plans now to add Gentamicin for synergy.     SCr 0.55 - stable.  TBW < IBW so will utilize TBW for calculations. Goal Gent peak 3-4 mcg/ml, trough <1 mcg/ml.   Plan: - Continue Vancomycin 1g IV every 8 hours - Start Gentamicin 100 mg IV every 12 hours - Will continue to follow renal function, culture results, LOT, and antibiotic de-escalation plans   Height: 6\' 1"  (185.4 cm) Weight: 70.5 kg (155 lb 6.8 oz) IBW/kg (Calculated) : 79.9  Temp (24hrs), Avg:98.6 F (37 C), Min:98.5 F (36.9 C), Max:98.7 F (37.1 C)  Recent Labs  Lab 11/06/22 1121 11/07/22 0356 11/08/22 0402 11/11/22 1023  WBC 17.7* 16.4* 15.1* 14.1*  CREATININE 0.56* 0.61 0.61 0.55*    Estimated Creatinine Clearance: 131 mL/min (A) (by C-G formula based on SCr of 0.55 mg/dL (L)).    Allergies  Allergen Reactions   Ampicillin Rash    diffuse maculopapular rash - added to allergy list per Dr. Drue Second   Ceftriaxone Rash    diffuse maculopapular rash - added to allergy list per Dr. Drue Second    Antimicrobials this admission: Vancomycin 11/10 >> Gentamicin 11/11 >>  Dose adjustments this admission:   Microbiology results: 10/30 BCx >> 4/4 E faecalis (pan-sensitive) 11/1 BCx >> NgF  Thank you for allowing pharmacy to be a part of this patient's care.  Georgina Pillion, PharmD, BCPS, BCIDP Infectious Diseases Clinical Pharmacist 11/12/2022 2:26 PM   **Pharmacist phone directory can now be found on [email or web address] ([email or web address]).  Listed under [facility].

## 2022-11-12 NOTE — Progress Notes (Signed)
Reviewed case with Dr. Odis Hollingshead; Dr. Wyline Mood signed off 11/5; was following peripherally. No new cardiology needs identified. Please notify cardmaster or cardiology APP when patient is being discharged so we can arrange outpatient follow-up. Please call if we can be of assistance.

## 2022-11-12 NOTE — Progress Notes (Signed)
    [facility]   Reason for visit: Follow up on endocarditis  Interval History: now with a diffuse rash, changed to vancomycin    Physical Exam: Constitutional:  Vitals:   11/12/22 0500 11/12/22 1358  BP: (!) 110/55 (!) 105/48  Pulse: (!) 109 (!) 103  Resp:    Temp: 98.5 F (36.9 C) 98.7 F (37.1 C)  SpO2: 99% 97%   patient appears in NAD Respiratory: Normal respiratory effort; CTA B Skin: diffuse, largely confluent maculopapular rash on trunk  Review of Systems: Constitutional: negative for fevers and chills  Lab Results  Component Value Date   WBC 14.1 (H) 11/11/2022   HGB 8.4 (L) 11/11/2022   HCT 27.1 (L) 11/11/2022   MCV 82.1 11/11/2022   PLT 397 11/11/2022    Lab Results  Component Value Date   CREATININE 0.55 (L) 11/11/2022   BUN 9 11/11/2022   NA 130 (L) 11/11/2022   K 3.3 (L) 11/11/2022   CL 99 11/11/2022   CO2 24 11/11/2022    Lab Results  Component Value Date   ALT 49 (H) 11/08/2022   AST 35 11/08/2022   ALKPHOS 101 11/08/2022     Microbiology: No results found for this or any previous visit (from the past 240 hour(s)).  Impression/Plan:  1. Endocarditis - E faecalis AV endocarditis and now on vancomycin after developing a rash with ampicillin/ceftriaxone.  Will need to add synergistic gentamicin with vancomycin Treatment through December 12.   2.  Rash - diffuse and certain possible beta-lactam rash.  Now on above antibiotics. Will likely take over 1 week to resolve.   3.  Polysubstance abuse - will need continued efforts at remaining off substances after discharge.    Will continue to follow intermittently

## 2022-11-12 NOTE — Progress Notes (Signed)
[facility] Progress Note  Patient: Rick Taylor [id number]:[id number] DOB: Apr 10, 1990 DOA: 10/30/2022  DOS: the patient was seen and examined on 11/12/2022  Brief hospital course: PMH of polysubstance abuse presented to the hospital with complaints of right leg pain. Currently being treated for Enterococcus bacteremia as well as right leg cellulitis. 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 Cardiology and cardiothoracic surgery (Dr. Leafy Ro) felt that the patient is not a surgical candidate.  Current plan is supportive care with IV antibiotics. 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 TTE shows evidence of preserved EF but torrential AI and severe MR with LV dilation. Cardiology was formally consulted, currently signed off Dr. Tereso Newcomer, reviewed the chart and recommended that the patient is not a surgical candidate for now due to ongoing heroin use Plan for reevaluation by CT surgery for consideration for surgical intervention Due to likely drug rash, IV ampicillin and ceftriaxone were discontinued on 11/10 per ID, recommend switching to IV Vancomycin and add gentamicin. Continue through December 12  Likely drug rash Noted widespread diffuse rash around torso, non pruritic  Discussed with ID, stop ceftriaxone/ampicillin as noted above Atarax prn, claritin for itching   Acute on chronic diastolic CHF Bilateral pleural effusion In the setting of severe MR and AI CT chest shows evidence of bilateral pleural  effusion Management per cardiology, signed off Continue PO lasix  Hypokalemia Replace prn   Right foot cellulitis No evidence of osteomyelitis/polymyositis/abscess/fasciitis MRI resulted in 11/2.  Reassuring for no evidence of osteomyelitis. Orthopedic rec local wound care/conservative  May require local debridement once more stable  Polysubstance abuse Tobacco abuse Patient reports that he abuses fentanyl IV multiple times, daily He is UDS is positive for amphetamine, opiates, cocaine, cannabis.  (UDS performed after receiving opiates in the hospital) Patient is willing to quit  Continue Suboxone, clonidine, clonazepam, ativan as needed Continue BuSpar for anxiety Nicotine patch  Attempt for substance use in the hospital Patient was caught attempting to inject substance in the hospital on 10/31 Security was called in, multiple syringes and needles were identified in his personal belongings as well as different substance Visitation restricted Telemonitoring initiated   Hyponatremia Unclear etiology Possibly ?vol overload Daily CMP   Elevated LFT Coagulopathy- elevated INR Unclear etiology Bilirubin normal Possible from bacteremia S/p Vit K Daily CMP   Anemia of chronic disease Hemoglobin dropped down to 6.9. Requiring blood transfusion. Fibrinogen normal.  INR 1.5.  Reticulocyte count elevated. Transfuse for hemoglobin less than 8. Posttransfusion H&H is stable.     Thrombocytopenia Leukocytosis Likely 2/2 bacteremia For now monitor  Constipation Due to severe abdominal pain a CT abdomen was performed No acute abnormality seen other than some constipation. Continue bowel regimen.   Moderate protein calorie malnutrition  Nutrition Problem: Moderate Malnutrition Etiology: social / environmental circumstances Nutrition Interventions: Interventions: Refer to RD note for recommendations Body mass index is 20.51 kg/m.    Goals of care conversation Multiple  discussion with the patient with regards to his goals of care per previous provider Dr Allena Katz Per CT surgery "  per Dr. Leafy Ro, consulting CT surgeon: Ongoing heroin use and a chronic wound that would need to be dealt with way before he would ever be considered a surgical candidate and also would need to be off heroine" Per cardiology recommended palliative care and DNR. Patient currently understands this and has been transitioned to DNR DNI.  Mother also understands that           Physical Exam: General: NAD  Cardiovascular: S1, S2 present Respiratory: CTAB Abdomen: Soft, nontender, nondistended, bowel sounds present Musculoskeletal: No bilateral pedal edema noted Skin: Widespread torso rash noted Psychiatry: Fair mood     Disposition: Status is: Inpatient Remains inpatient appropriate because: Need further workup and IV antibiotics.  enoxaparin (LOVENOX) injection 40 mg Start: 11/03/22 1000   Family Communication: No one at bedside  Level of care: Progressive   Vitals:   11/11/22 1822 11/11/22 1909 11/12/22 0500 11/12/22 1358  BP:  (!) 109/51 (!) 110/55 (!) 105/48  Pulse:   (!) 109 (!) 103  Resp:  20    Temp: 98.6 F (37 C) 98.7 F (37.1 C) 98.5 F (36.9 C) 98.7 F (37.1 C)  TempSrc: Oral Oral Oral Oral  SpO2:  99% 99% 97%  Weight:      Height:          Briant Cedar, MD 11/12/2022 3:42 PM  Please look on [email or web address] to find out who is on call.

## 2022-11-13 LAB — BASIC METABOLIC PANEL
Anion gap: 8 (ref 5–15)
BUN: 13 mg/dL (ref 6–20)
CO2: 24 mmol/L (ref 22–32)
Calcium: 8.2 mg/dL — ABNORMAL LOW (ref 8.9–10.3)
Chloride: 96 mmol/L — ABNORMAL LOW (ref 98–111)
Creatinine, Ser: 0.75 mg/dL (ref 0.61–1.24)
GFR, Estimated: >60 mL/min (ref >60)
Glucose, Bld: 99 mg/dL (ref 70–99)
Potassium: 4 mmol/L (ref 3.5–5.1)
Sodium: 128 mmol/L — ABNORMAL LOW (ref 135–145)

## 2022-11-13 LAB — VANCOMYCIN, PEAK: Vancomycin Pk: 33 ug/mL (ref 30–40)

## 2022-11-13 LAB — VANCOMYCIN, TROUGH: Vancomycin Tr: 18 ug/mL (ref 15–20)

## 2022-11-13 MED ORDER — VANCOMYCIN HCL 750 MG/150ML IV SOLN
750.0000 mg | Freq: Three times a day (TID) | INTRAVENOUS | Status: DC
Start: 2022-11-13 — End: 2022-11-18
  Administered 2022-11-13 – 2022-11-17 (×14): 750 mg via INTRAVENOUS
  Filled 2022-11-13 (×15): qty 150

## 2022-11-13 NOTE — Progress Notes (Signed)
Pt pleasantly refused wound care/bandage change on R leg x2 different times during shift. Pt also refused to have RN and CMA bathe/freshen him up. Pt stated he "wanted to be left alone" and "wanted to sleep". Saraia Platner S Ashlyn Cabler

## 2022-11-13 NOTE — Progress Notes (Addendum)
Nutrition Follow-up  DOCUMENTATION CODES:   Non-severe (moderate) malnutrition in context of social or environmental circumstances  INTERVENTION:  - Heart healthy diet per MD.  - Discontinue Ensure Plus High Protein and Boost Breeze as patient not consuming.  - Add Boost Plus po BID, each supplement provides 360 kcal and 14 grams of protein -  Add Magic cup BID with meals, each supplement provides 290 kcal and 9 grams of protein  -Encourage intake at all meals of protein rich food sources in addition to supplements. - Continue Multivitamin with minerals daily, 500mg  vitamin C BID, 220mg  zinc x14 days (ends tomorrow) to support wound healing.  - Monitor weight trends.    NUTRITION DIAGNOSIS:   Moderate Malnutrition related to social / environmental circumstances as evidenced by severe fat depletion, moderate muscle depletion. *ongoing  GOAL:   Patient will meet greater than or equal to 90% of their needs *progressing  MONITOR:   PO intake, Supplement acceptance, Diet advancement, Weight trends  REASON FOR ASSESSMENT:   Consult Wound healing  ASSESSMENT:   32 y.o. male who presented with infected wound to right lower extremity. Admitted for sepsis due to cellulitis.  Patient sleeping at time of visit, did not awake to sound of voice.  Per chart review he has been refusing care often due to wanting to sleep.   He is documented to be consuming 50-100% of meals with average of 91% over the past few days. Has been refusing Ensure Max and [employer] with comments that he feels they are giving him diarrhea. Patient noted to be on several antibiotics and suspect this is the cause of patient's diarrhea as he has reported enjoying Ensure in the past with no issues. Also noted to only be having ~1 bowel movement a day or less. Can consider Banatrol if needed/diarrhea worsens. For now, will trail Magic Cup and Boost Plus for other options.    Medications reviewed and include:  500mg  vitamin C BID, MVI, 220mg  zinc (ends tomorrow), vitamin B12, 325mg  ferrous sulfate, Colace  Labs reviewed:  Na 128   Diet Order:   Diet Order             Diet Heart Fluid consistency: Thin  Diet effective now                   EDUCATION NEEDS:  Education needs have been addressed  Skin:  Skin Assessment: Skin Integrity Issues: Skin Integrity Issues:: Other (Comment) Other: Right leg cellulitis  Last BM:  11/10  Height:  Ht Readings from Last 1 Encounters:  10/31/22 6\' 1"  (1.854 m)   Weight:  Wt Readings from Last 1 Encounters:  10/31/22 70.5 kg    BMI:  Body mass index is 20.51 kg/m.  Estimated Nutritional Needs:  Kcal:  2050-2250 kcals Protein:  100-135 grams Fluid:  >/= 2L    Shelle Iron RD, LDN For contact information, refer to [facility].

## 2022-11-13 NOTE — Plan of Care (Signed)
  Problem: Coping: Goal: Level of anxiety will decrease Outcome: Progressing   Problem: Elimination: Goal: Will not experience complications related to urinary retention Outcome: Progressing   Problem: Safety: Goal: Ability to remain free from injury will improve Outcome: Progressing   Problem: Education: Goal: Knowledge of General Education information will improve Description: Including pain rating scale, medication(s)/side effects and non-pharmacologic comfort measures Outcome: Not Progressing   Problem: Health Behavior/Discharge Planning: Goal: Ability to manage health-related needs will improve Outcome: Not Progressing   Problem: Clinical Measurements: Goal: Cardiovascular complication will be avoided Outcome: Not Progressing   Problem: Skin Integrity: Goal: Risk for impaired skin integrity will decrease Outcome: Not Progressing

## 2022-11-13 NOTE — Progress Notes (Signed)
[facility] Progress Note  Patient: Rick Taylor [id number]:[id number] DOB: November 12, 1990 DOA: 10/30/2022  DOS: the patient was seen and examined on 11/13/2022  Brief hospital course: PMH of polysubstance abuse presented to the hospital with complaints of right leg pain. Currently being treated for Enterococcus bacteremia as well as right leg cellulitis. 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 Cardiology and cardiothoracic surgery (Dr. Leafy Ro) felt that the patient is not a surgical candidate.  Current plan is supportive care with IV antibiotics. 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 TTE shows evidence of preserved EF but torrential AI and severe MR with LV dilation. Cardiology was formally consulted, currently signed off Dr. Tereso Newcomer, reviewed the chart and recommended that the patient is not a surgical candidate for now due to ongoing heroin use Plan for reevaluation by CT surgery for consideration for surgical intervention Due to likely drug rash, IV ampicillin and ceftriaxone were discontinued on 11/10 per ID, recommend switching to IV Vancomycin and add gentamicin. Continue through December 12  Likely drug rash Noted widespread diffuse rash around torso, non pruritic  Discussed with ID, stop ceftriaxone/ampicillin as noted above Atarax prn, claritin for itching   Acute on chronic diastolic CHF Bilateral pleural effusion In the setting of severe MR and AI CT chest shows evidence of bilateral pleural effusion Management per  cardiology, signed off Continue PO lasix  Hypokalemia Replace prn   Right foot cellulitis No evidence of osteomyelitis/polymyositis/abscess/fasciitis MRI resulted in 11/2.  Reassuring for no evidence of osteomyelitis. Orthopedic rec local wound care/conservative  May require local debridement once more stable  Polysubstance abuse Tobacco abuse Patient reports that he abuses fentanyl IV multiple times, daily He is UDS is positive for amphetamine, opiates, cocaine, cannabis.  (UDS performed after receiving opiates in the hospital) Patient is willing to quit  Continue Suboxone, clonidine, clonazepam, ativan as needed Continue BuSpar for anxiety Nicotine patch  Attempt for substance use in the hospital Patient was caught attempting to inject substance in the hospital on 10/31 Security was called in, multiple syringes and needles were identified in his personal belongings as well as different substance Visitation restricted Telemonitoring initiated   Hyponatremia Unclear etiology Possibly ?vol overload Daily CMP   Elevated LFT Coagulopathy- elevated INR Unclear etiology Bilirubin normal Possible from bacteremia S/p Vit K Daily CMP   Anemia of chronic disease Hemoglobin dropped down to 6.9. Requiring blood transfusion. Fibrinogen normal.  INR 1.5.  Reticulocyte count elevated. Transfuse for hemoglobin less than 8. Posttransfusion H&H is stable.     Thrombocytopenia Leukocytosis Likely 2/2 bacteremia For now monitor  Constipation Due to severe abdominal pain a CT abdomen was performed No acute abnormality seen other than some constipation. Continue bowel regimen.   Moderate protein calorie malnutrition  Nutrition Problem: Moderate Malnutrition Etiology: social / environmental circumstances Nutrition Interventions: Interventions: Refer to RD note for recommendations Body mass index is 20.51 kg/m.    Goals of care conversation Multiple discussion with the  patient with regards to his goals of care per previous provider Dr Allena Katz Per CT surgery " per Dr.  Weldner, consulting CT surgeon: Ongoing heroin use and a chronic wound that would need to be dealt with way before he would ever be considered a surgical candidate and also would need to be off heroine" Per cardiology recommended palliative care and DNR. Patient currently understands this and has been transitioned to DNR DNI.  Mother also understands that           Physical Exam: General: NAD  Cardiovascular: S1, S2 present Respiratory: CTAB Abdomen: Soft, nontender, nondistended, bowel sounds present Musculoskeletal: No bilateral pedal edema noted Skin: Widespread torso rash noted Psychiatry: Fair mood     Disposition: Status is: Inpatient Remains inpatient appropriate because: Need further workup and IV antibiotics.  enoxaparin (LOVENOX) injection 40 mg Start: 11/03/22 1000   Family Communication: No one at bedside  Level of care: Progressive   Vitals:   11/12/22 1358 11/12/22 2004 11/13/22 0025 11/13/22 1309  BP: (!) 105/48 (!) 96/37 (!) 101/49 (!) 101/42  Pulse: (!) 103 (!) 112 (!) 108 (!) 106  Resp:    18  Temp: 98.7 F (37.1 C) 98.3 F (36.8 C) 97.7 F (36.5 C) 98.4 F (36.9 C)  TempSrc: Oral Oral Oral   SpO2: 97% 95% 97% 99%  Weight:      Height:          Briant Cedar, MD 11/13/2022 7:05 PM  Please look on [email or web address] to find out who is on call.

## 2022-11-14 DIAGNOSIS — E440 Moderate protein-calorie malnutrition: Secondary | ICD-10-CM

## 2022-11-14 DIAGNOSIS — Z720 Tobacco use: Secondary | ICD-10-CM

## 2022-11-14 DIAGNOSIS — L03115 Cellulitis of right lower limb: Secondary | ICD-10-CM

## 2022-11-14 DIAGNOSIS — R7989 Other specified abnormal findings of blood chemistry: Secondary | ICD-10-CM

## 2022-11-14 DIAGNOSIS — R739 Hyperglycemia, unspecified: Secondary | ICD-10-CM

## 2022-11-14 DIAGNOSIS — D509 Iron deficiency anemia, unspecified: Secondary | ICD-10-CM

## 2022-11-14 DIAGNOSIS — E871 Hypo-osmolality and hyponatremia: Secondary | ICD-10-CM

## 2022-11-14 LAB — CBC WITH DIFFERENTIAL/PLATELET
Abs Immature Granulocytes: 0.11 10*3/uL — ABNORMAL HIGH (ref 0.00–0.07)
Basophils Absolute: 0.2 10*3/uL — ABNORMAL HIGH (ref 0.0–0.1)
Basophils Relative: 1 %
Eosinophils Absolute: 0.2 10*3/uL (ref 0.0–0.5)
Eosinophils Relative: 2 %
HCT: 27.7 % — ABNORMAL LOW (ref 39.0–52.0)
Hemoglobin: 8.3 g/dL — ABNORMAL LOW (ref 13.0–17.0)
Immature Granulocytes: 1 %
Lymphocytes Relative: 12 %
Lymphs Abs: 1.8 10*3/uL (ref 0.7–4.0)
MCH: 24.6 pg — ABNORMAL LOW (ref 26.0–34.0)
MCHC: 30 g/dL (ref 30.0–36.0)
MCV: 82.2 fL (ref 80.0–100.0)
Monocytes Absolute: 0.7 10*3/uL (ref 0.1–1.0)
Monocytes Relative: 5 %
Neutro Abs: 12 10*3/uL — ABNORMAL HIGH (ref 1.7–7.7)
Neutrophils Relative %: 79 %
Platelets: 452 10*3/uL — ABNORMAL HIGH (ref 150–400)
RBC: 3.37 MIL/uL — ABNORMAL LOW (ref 4.22–5.81)
RDW: 21.1 % — ABNORMAL HIGH (ref 11.5–15.5)
WBC: 15 10*3/uL — ABNORMAL HIGH (ref 4.0–10.5)
nRBC: 0 % (ref 0.0–0.2)

## 2022-11-14 LAB — BASIC METABOLIC PANEL
Anion gap: 10 (ref 5–15)
BUN: 13 mg/dL (ref 6–20)
CO2: 24 mmol/L (ref 22–32)
Calcium: 8.2 mg/dL — ABNORMAL LOW (ref 8.9–10.3)
Chloride: 95 mmol/L — ABNORMAL LOW (ref 98–111)
Creatinine, Ser: 0.74 mg/dL (ref 0.61–1.24)
GFR, Estimated: >60 mL/min (ref >60)
Glucose, Bld: 97 mg/dL (ref 70–99)
Potassium: 3.5 mmol/L (ref 3.5–5.1)
Sodium: 129 mmol/L — ABNORMAL LOW (ref 135–145)

## 2022-11-14 MED ORDER — GENTAMICIN IN SALINE 1-0.9 MG/ML-% IV SOLN
100.0000 mg | Freq: Two times a day (BID) | INTRAVENOUS | Status: DC
  Administered 2022-11-14 – 2022-11-17 (×7): 100 mg via INTRAVENOUS
  Filled 2022-11-14 (×8): qty 100

## 2022-11-14 MED ORDER — POTASSIUM CHLORIDE CRYS ER 20 MEQ PO TBCR
40.0000 meq | EXTENDED_RELEASE_TABLET | Freq: Once | ORAL | Status: AC
  Administered 2022-11-14: 40 meq via ORAL
  Filled 2022-11-14: qty 2

## 2022-11-14 NOTE — Plan of Care (Signed)
  Problem: Education: Goal: Knowledge of General Education information will improve Description: Including pain rating scale, medication(s)/side effects and non-pharmacologic comfort measures Outcome: Progressing   Problem: Health Behavior/Discharge Planning: Goal: Ability to manage health-related needs will improve Outcome: Progressing   Problem: Clinical Measurements: Goal: Ability to maintain clinical measurements within normal limits will improve Outcome: Progressing Goal: Will remain free from infection Outcome: Progressing Goal: Diagnostic test results will improve Outcome: Progressing Goal: Respiratory complications will improve Outcome: Progressing Goal: Cardiovascular complication will be avoided Outcome: Progressing   Problem: Activity: Goal: Risk for activity intolerance will decrease Outcome: Progressing   Problem: Nutrition: Goal: Adequate nutrition will be maintained Outcome: Progressing   Problem: Coping: Goal: Level of anxiety will decrease Outcome: Progressing   Problem: Elimination: Goal: Will not experience complications related to bowel motility Outcome: Progressing Goal: Will not experience complications related to urinary retention Outcome: Progressing   Problem: Pain Management: Goal: General experience of comfort will improve Outcome: Progressing   Problem: Safety: Goal: Ability to remain free from injury will improve Outcome: Progressing   Problem: Skin Integrity: Goal: Risk for impaired skin integrity will decrease Outcome: Progressing   Problem: Education: Goal: Ability to state activities that reduce stress will improve Outcome: Progressing   Problem: Coping: Goal: Ability to identify and develop effective coping behavior will improve Outcome: Progressing   Problem: Self-Concept: Goal: Ability to identify factors that promote anxiety will improve Outcome: Progressing Goal: Level of anxiety will decrease Outcome:  Progressing Goal: Ability to modify response to factors that promote anxiety will improve Outcome: Progressing

## 2022-11-14 NOTE — Progress Notes (Signed)
[facility] Progress Note  Patient: Rick Taylor [id number]:[id number] DOB: Jun 06, 1990 DOA: 10/30/2022  DOS: the patient was seen and examined on 11/14/2022  Brief hospital course:  Patient is a 32 years old male with past medical history of  polysubstance abuse presented to the hospital with complaints of right leg pain. Currently being treated for Enterococcus bacteremia as well as right leg cellulitis. 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 Cardiology and cardiothoracic surgery (Dr. Leafy Ro) felt that the patient is not a surgical candidate.  Current plan is supportive care with IV antibiotics. Eventually need to be considered for debridement of right leg wound and reconsideration for CT surgery evaluation as he clinically improves and hopefully off substance use.  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 TTE shows evidence of preserved EF but torrential AI and severe MR with LV dilation. Cardiology was formally consulted, currently signed off.  CT surgery reviewed the chart and recommended that the patient is not a surgical candidate for now due to ongoing heroin use Plan for reevaluation by CT surgery for consideration for surgical intervention as outpatient. Due to likely drug rash, IV ampicillin and ceftriaxone were discontinued on 11/10 per ID, on IV Vancomycin and  gentamicin. Continue through December 13, 2022.  Mild leukocytosis persists  Likely drug rash Off ceftriaxone/ampicillin, Atarax and Claritin.  Acute on chronic diastolic CHF Bilateral pleural effusion In the setting of severe MR and AI.  CT scan of the chest showed bilateral pleural effusion. Improved at this time.  Continue p.o. Lasix  Hypokalemia Improved.  Latest potassium of 3.5.  Will continue to replenish  Mild hyponatremia.   Asymptomatic.  Check BMP in AM.  On oral Lasix.   Right foot cellulitis MRI with no evidence of osteomyelitis. Orthopedic rec local wound care/conservative  May require local debridement once more stable  Polysubstance abuse Tobacco abuse Fentanyl IV abuse as outpatient.  Urine drug screen was positive for amphetamine, opiates, cocaine, cannabis.  (UDS performed after receiving opiates in the hospital). Patient is willing to quit  Continue Suboxone, clonidine, clonazepam, ativan as needed Continue BuSpar for anxiety Continue nicotine patch  Attempt for substance use in the hospital Patient was caught attempting to inject substance in the hospital on 10/31 Security was called in, multiple syringes and needles were identified in his personal belongings as well as different substance Visitation restricted  Elevated LFT Coagulopathy- elevated INR Possible from bacteremia S/p Vit K.  Check CMP, INR in AM   Anemia of chronic disease Hemoglobin improved to 8.3 after 1 unit of transfusion for 6.9. Fibrinogen normal.  INR 1.5. Posttransfusion H&H is stable.     Leukocytosis Likely 2/2 bacteremia.  Will continue to monitor.  Afebrile at this time.  Constipation CT abdomen was negative.  Continue bowel regimen.   Moderate protein calorie malnutrition  Nutrition Problem: Moderate Malnutrition Etiology: social / environmental circumstances Nutrition Interventions: Interventions: Refer to RD note for recommendations Body mass index is 20.51 kg/m.    Goals of care conversation Multiple discussion was held by the previous provider with the patient with regards to his goals of care per previous provider Dr Allena Katz.  Per CT surgery " per Dr. Leafy Ro, consulting CT surgeon: Ongoing heroin use and a chronic wound that would need to be dealt with way before he would ever be considered a surgical candidate  and also would need to be off heroine" Per cardiology recommended palliative care and DNR.  Patient currently understands this and has been transitioned to DNR DNI.      Subjective Today, patient was seen and examined at bedside.  Patient states that he did have itchy rash on his body.  Denies any nausea, vomiting, shortness of breath, chest pain, fever or chills.   Physical Exam:    11/14/2022    9:30 AM 11/14/2022    9:20 AM 11/14/2022    6:00 AM  Vitals with BMI  Systolic 117 117 782  Diastolic 54 54 47  Pulse 82 80     Body mass index is 20.51 kg/m.  General:  Average built, not in obvious distress HENT:   No scleral pallor or icterus noted. Oral mucosa is moist.  Chest:  Clear breath sounds.   No crackles or wheezes.  CVS: S1 &S2 heard.  Murmur noted regular rate and rhythm. Abdomen: Soft, nontender, nondistended.  Bowel sounds are heard.   Extremities: No cyanosis, clubbing or edema.  Peripheral pulses are palpable. Psych: Alert, awake and oriented, normal mood CNS:  No cranial nerve deficits.  Power equal in all extremities.   Skin: Warm and dry.  Diffuse macular rash noted over the body   Disposition: Status is: Inpatient  Remains inpatient appropriate because: Need IV antibiotics.   Family Communication: None at bedside  Level of care: Progressive      Joycelyn Das, MD 11/14/2022 10:32 AM

## 2022-11-14 NOTE — Plan of Care (Signed)
  Problem: Clinical Measurements: Goal: Ability to maintain clinical measurements within normal limits will improve Outcome: Progressing Goal: Will remain free from infection Outcome: Progressing Goal: Diagnostic test results will improve Outcome: Progressing Goal: Cardiovascular complication will be avoided Outcome: Progressing   Problem: Activity: Goal: Risk for activity intolerance will decrease Outcome: Progressing   Problem: Pain Management: Goal: General experience of comfort will improve Outcome: Progressing   Problem: Skin Integrity: Goal: Risk for impaired skin integrity will decrease Outcome: Progressing   Problem: Self-Concept: Goal: Ability to modify response to factors that promote anxiety will improve Outcome: Progressing   Problem: Education: Goal: Knowledge of General Education information will improve Description: Including pain rating scale, medication(s)/side effects and non-pharmacologic comfort measures Outcome: Adequate for Discharge   Problem: Health Behavior/Discharge Planning: Goal: Ability to manage health-related needs will improve Outcome: Adequate for Discharge   Problem: Clinical Measurements: Goal: Respiratory complications will improve Outcome: Adequate for Discharge   Problem: Nutrition: Goal: Adequate nutrition will be maintained Outcome: Adequate for Discharge   Problem: Coping: Goal: Level of anxiety will decrease Outcome: Adequate for Discharge   Problem: Elimination: Goal: Will not experience complications related to bowel motility Outcome: Adequate for Discharge Goal: Will not experience complications related to urinary retention Outcome: Adequate for Discharge   Problem: Safety: Goal: Ability to remain free from injury will improve Outcome: Adequate for Discharge   Problem: Self-Concept: Goal: Ability to identify factors that promote anxiety will improve Outcome: Adequate for Discharge Goal: Level of anxiety will  decrease Outcome: Adequate for Discharge

## 2022-11-15 LAB — COMPREHENSIVE METABOLIC PANEL
ALT: 24 U/L (ref 0–44)
AST: 18 U/L (ref 15–41)
Albumin: 2.5 g/dL — ABNORMAL LOW (ref 3.5–5.0)
Alkaline Phosphatase: 90 U/L (ref 38–126)
Anion gap: 9 (ref 5–15)
BUN: 16 mg/dL (ref 6–20)
CO2: 23 mmol/L (ref 22–32)
Calcium: 8.3 mg/dL — ABNORMAL LOW (ref 8.9–10.3)
Chloride: 96 mmol/L — ABNORMAL LOW (ref 98–111)
Creatinine, Ser: 0.65 mg/dL (ref 0.61–1.24)
GFR, Estimated: >60 mL/min (ref >60)
Glucose, Bld: 105 mg/dL — ABNORMAL HIGH (ref 70–99)
Potassium: 3.9 mmol/L (ref 3.5–5.1)
Sodium: 128 mmol/L — ABNORMAL LOW (ref 135–145)
Total Bilirubin: 0.5 mg/dL (ref <1.2)
Total Protein: 6.9 g/dL (ref 6.5–8.1)

## 2022-11-15 LAB — CBC
HCT: 27.8 % — ABNORMAL LOW (ref 39.0–52.0)
Hemoglobin: 8.4 g/dL — ABNORMAL LOW (ref 13.0–17.0)
MCH: 25.1 pg — ABNORMAL LOW (ref 26.0–34.0)
MCHC: 30.2 g/dL (ref 30.0–36.0)
MCV: 83.2 fL (ref 80.0–100.0)
Platelets: 403 10*3/uL — ABNORMAL HIGH (ref 150–400)
RBC: 3.34 MIL/uL — ABNORMAL LOW (ref 4.22–5.81)
RDW: 21.2 % — ABNORMAL HIGH (ref 11.5–15.5)
WBC: 13.7 10*3/uL — ABNORMAL HIGH (ref 4.0–10.5)
nRBC: 0 % (ref 0.0–0.2)

## 2022-11-15 LAB — PROTIME-INR
INR: 1.3 — ABNORMAL HIGH (ref 0.8–1.2)
Prothrombin Time: 16.5 s — ABNORMAL HIGH (ref 11.4–15.2)

## 2022-11-15 LAB — GENTAMICIN LEVEL, TROUGH: Gentamicin Trough: 0.5 ug/mL (ref 0.5–2.0)

## 2022-11-15 LAB — MAGNESIUM: Magnesium: 2 mg/dL (ref 1.7–2.4)

## 2022-11-15 LAB — GENTAMICIN LEVEL, PEAK: Gentamicin Pk: 3.4 ug/mL — ABNORMAL LOW (ref 5.0–10.0)

## 2022-11-15 NOTE — Progress Notes (Addendum)
[facility] Progress Note  Patient: Rick Taylor ZOX:[id number] DOB: 1990/12/05 DOA: 10/30/2022  DOS: the patient was seen and examined on 11/15/2022  Brief hospital course:  Patient is a 32 years old male with past medical history of  polysubstance abuse presented to the hospital with complaints of right leg pain. Currently being treated for Enterococcus bacteremia as well as right leg cellulitis. 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 Cardiology and cardiothoracic surgery (Dr. Leafy Ro) felt that the patient is not a surgical candidate.  Current plan is supportive care with IV antibiotics. Eventually need to be considered for debridement of right leg wound and reconsideration for CT surgery evaluation as he clinically improves and hopefully off substance use.  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 TTE shows evidence of preserved EF but torrential AI and severe MR with LV dilation. Cardiology was formally consulted, currently signed off.  CT surgery reviewed the chart and recommended that the patient is not a surgical candidate for now due to ongoing heroin use Plan for reevaluation by CT surgery for consideration for surgical intervention as outpatient. Due to likely drug rash, IV ampicillin and ceftriaxone were discontinued on 11/10 per ID, on IV Vancomycin and  gentamicin currently.EOT December 13, 2022.  Mild leukocytosis persists  Likely drug rash Off ceftriaxone/ampicillin, Atarax and Claritin.  Improving.  Acute on chronic diastolic CHF Bilateral pleural effusion In the setting of severe MR and AI.  CT scan of the chest showed bilateral pleural effusion. Improved at this time.  Continue p.o. Lasix  Hypokalemia Improved.   Mild hyponatremia.  Asymptomatic.  Latest sodium of 128.  On oral Lasix.  Will  continue to monitor.   Right foot cellulitis MRI with no evidence of osteomyelitis. Orthopedic rec local wound care/conservative  May require local debridement once more stable.  Currently on wound care.  Polysubstance abuse Tobacco abuse Fentanyl IV abuse as outpatient.  Urine drug screen was positive for amphetamine, opiates, cocaine, cannabis.  (UDS performed after receiving opiates in the hospital). Patient is willing to quit  Continue Suboxone, clonidine, clonazepam, ativan as needed. Continue BuSpar  nicotine patch  Attempt for substance use in the hospital Visitation restricted  Elevated LFT Coagulopathy- elevated INR Possible from bacteremia S/p Vit K.  INR is at 1.3 today   Anemia of chronic disease Hemoglobin improved to 8.3 after 1 unit of transfusion for 6.9. Fibrinogen normal.  INR 1.5. Posttransfusion H&H is stable.  Latest hemoglobin of 8.4   Leukocytosis Likely 2/2 bacteremia.  Will continue to monitor.  Afebrile at this time.  Temperature max of 98.9 F  Constipation CT abdomen was negative.  Continue bowel regimen.   Moderate protein calorie malnutrition  Nutrition Problem: Moderate Malnutrition Etiology: social / environmental circumstances Nutrition Interventions: Interventions: Refer to RD note for recommendations Body mass index is 20.51 kg/m.    Goals of care conversation Multiple discussion was held by the previous provider with the patient with regards to his goals of care per previous provider Dr Allena Katz.  Per CT surgery " per Dr. Leafy Ro, consulting CT surgeon: Ongoing heroin use and a chronic wound that would need to be dealt with way before he would ever be considered a surgical candidate and also would need to be off heroine" Per cardiology recommended palliative care and DNR. Patient currently understands this and has been transitioned to DNR  DNI.     Family communication.  Tried to reach the patient's mother on the phone but was unable to reach  her today.   Subjective Today, patient was seen and examined at bedside.  Patient denies any shortness of breath, chest pain, fever, chills or rigor.  States that his rash has improved.     Physical Exam:    11/15/2022    6:18 AM 11/14/2022    8:08 PM 11/14/2022    2:55 PM  Vitals with BMI  Systolic 120 117 657  Diastolic 46 47 45  Pulse 113 115 110    Body mass index is 20.51 kg/m.   General:  Average built, not in obvious distress HENT:   No scleral pallor or icterus noted. Oral mucosa is moist.  Chest:  Clear breath sounds.   No crackles or wheezes.  CVS: S1 &S2 heard.  Murmur noted, regular rate and rhythm. Abdomen: Soft, nontender, nondistended.  Bowel sounds are heard.   Extremities: No cyanosis, clubbing or edema.  Peripheral pulses are palpable. Psych: Alert, awake and oriented, normal mood CNS:  No cranial nerve deficits.  Power equal in all extremities.   Skin: Warm and dry.  Diffuse macular rash noted over the body with some scaling   Disposition: Status is: Inpatient  Remains inpatient appropriate because: Need IV antibiotics.   Family Communication: None at bedside  Level of care: Progressive      Joycelyn Das, MD 11/15/2022 10:42 AM

## 2022-11-15 NOTE — Plan of Care (Signed)
  Problem: Education: Goal: Knowledge of General Education information will improve Description: Including pain rating scale, medication(s)/side effects and non-pharmacologic comfort measures Outcome: Progressing   Problem: Health Behavior/Discharge Planning: Goal: Ability to manage health-related needs will improve Outcome: Progressing   Problem: Clinical Measurements: Goal: Ability to maintain clinical measurements within normal limits will improve Outcome: Progressing Goal: Will remain free from infection Outcome: Progressing Goal: Diagnostic test results will improve Outcome: Progressing Goal: Respiratory complications will improve Outcome: Progressing Goal: Cardiovascular complication will be avoided Outcome: Progressing   Problem: Activity: Goal: Risk for activity intolerance will decrease Outcome: Progressing   Problem: Nutrition: Goal: Adequate nutrition will be maintained Outcome: Progressing   Problem: Coping: Goal: Level of anxiety will decrease Outcome: Progressing   Problem: Elimination: Goal: Will not experience complications related to bowel motility Outcome: Progressing Goal: Will not experience complications related to urinary retention Outcome: Progressing   Problem: Pain Management: Goal: General experience of comfort will improve Outcome: Progressing   Problem: Safety: Goal: Ability to remain free from injury will improve Outcome: Progressing   Problem: Skin Integrity: Goal: Risk for impaired skin integrity will decrease Outcome: Progressing   Problem: Education: Goal: Ability to state activities that reduce stress will improve Outcome: Progressing   Problem: Coping: Goal: Ability to identify and develop effective coping behavior will improve Outcome: Progressing   Problem: Self-Concept: Goal: Ability to identify factors that promote anxiety will improve Outcome: Progressing Goal: Level of anxiety will decrease Outcome:  Progressing Goal: Ability to modify response to factors that promote anxiety will improve Outcome: Progressing

## 2022-11-16 DIAGNOSIS — I358 Other nonrheumatic aortic valve disorders: Secondary | ICD-10-CM

## 2022-11-16 NOTE — Progress Notes (Signed)
    [facility]   Reason for visit: Follow up on bacteremia  Interval History: rash improving overall; WBC 13.7, remains afebrile; no new complaints   Physical Exam: Constitutional:  Vitals:   11/16/22 0923 11/16/22 1350  BP: (!) 123/55 (!) 114/39  Pulse: (!) 111 (!) 107  Resp: (!) 27 16  Temp: 97.6 F (36.4 C) 97.9 F (36.6 C)  SpO2: 98% 94%   patient appears in NAD Respiratory: Normal respiratory effort Skin: diffuse rash with less erythema  Review of Systems: Constitutional: negative for fevers and chills  Lab Results  Component Value Date   WBC 13.7 (H) 11/15/2022   HGB 8.4 (L) 11/15/2022   HCT 27.8 (L) 11/15/2022   MCV 83.2 11/15/2022   PLT 403 (H) 11/15/2022    Lab Results  Component Value Date   CREATININE 0.65 11/15/2022   BUN 16 11/15/2022   NA 128 (L) 11/15/2022   K 3.9 11/15/2022   CL 96 (L) 11/15/2022   CO2 23 11/15/2022    Lab Results  Component Value Date   ALT 24 11/15/2022   AST 18 11/15/2022   ALKPHOS 90 11/15/2022     Microbiology: No results found for this or any previous visit (from the past 240 hour(s)).  Impression/Plan:  1. Aortic valve endocarditis - positive culture with E faecalis and now on vancomycin + gentamicin.  Will continue with this and plan to treat through December 12th.    2.  Medication monitoring - vancomycin and gentamicin levels being checked and appropriate at this time.   Will continue to monitor.   3.   Rash - improving overall, less itch. Continue supportive care.    Dr. Renold Don available over the weekend if needed.

## 2022-11-16 NOTE — Plan of Care (Signed)
  Problem: Education: Goal: Knowledge of General Education information will improve Description: Including pain rating scale, medication(s)/side effects and non-pharmacologic comfort measures Outcome: Progressing   Problem: Health Behavior/Discharge Planning: Goal: Ability to manage health-related needs will improve Outcome: Progressing   Problem: Clinical Measurements: Goal: Ability to maintain clinical measurements within normal limits will improve Outcome: Progressing Goal: Will remain free from infection Outcome: Progressing Goal: Diagnostic test results will improve Outcome: Progressing Goal: Respiratory complications will improve Outcome: Progressing Goal: Cardiovascular complication will be avoided Outcome: Progressing   Problem: Activity: Goal: Risk for activity intolerance will decrease Outcome: Progressing   Problem: Nutrition: Goal: Adequate nutrition will be maintained Outcome: Progressing   Problem: Coping: Goal: Level of anxiety will decrease Outcome: Progressing   Problem: Elimination: Goal: Will not experience complications related to bowel motility Outcome: Progressing Goal: Will not experience complications related to urinary retention Outcome: Progressing   Problem: Pain Management: Goal: General experience of comfort will improve Outcome: Progressing   Problem: Safety: Goal: Ability to remain free from injury will improve Outcome: Progressing   Problem: Skin Integrity: Goal: Risk for impaired skin integrity will decrease Outcome: Progressing   Problem: Education: Goal: Ability to state activities that reduce stress will improve Outcome: Progressing   Problem: Coping: Goal: Ability to identify and develop effective coping behavior will improve Outcome: Progressing   Problem: Self-Concept: Goal: Ability to identify factors that promote anxiety will improve Outcome: Progressing Goal: Level of anxiety will decrease Outcome:  Progressing Goal: Ability to modify response to factors that promote anxiety will improve Outcome: Progressing

## 2022-11-16 NOTE — Progress Notes (Signed)
Pharmacy Antibiotic Note  Rick Taylor is a 32 y.o. male admitted on 10/28/2022 with E faecalis AV endocarditis with severe AV/MV regurgitation - not a candidate for CVTS surgery at this time. The patient developed a diffuse maculopapular rash over the weekend and was transitioned to Vancomycin + Gentamicin for synergy.  A gentamicin peak/trough drawn on 11/14 resulted as 3.4 and 0.5 mcg/ml respectively (true peak closer 4.2 mcg/ml when corrected). The dose remains appropriate at this time - will plan to hold the course and continue to monitor renal function closely.   Goal Vanc AUC 400-550, VT>10 Goal Gent peak 3-5, trough <1  Plan: - Continue Vancomycin to 750 mg IV every 8 hours (new eAUC 477, VT 12.7) - Continue Gentamicin 100 mg IV every 12 hours - Will continue to follow renal function, culture results, LOT, and antibiotic de-escalation plans   Height: 6\' 1"  (185.4 cm) Weight: 70.5 kg (155 lb 6.8 oz) IBW/kg (Calculated) : 79.9  Temp (24hrs), Avg:98.2 F (36.8 C), Min:98 F (36.7 C), Max:98.4 F (36.9 C)  Recent Labs  Lab 11/11/22 1023 11/13/22 0403 11/13/22 0815 11/13/22 1325 11/14/22 0357 11/15/22 0408 11/15/22 0738 11/15/22 1710  WBC 14.1*  --   --   --  15.0* 13.7*  --   --   CREATININE 0.55* 0.75  --   --  0.74 0.65  --   --   VANCOTROUGH  --   --   --  18  --   --   --   --   VANCOPEAK  --   --  33  --   --   --   --   --   GENTTROUGH  --   --   --   --   --   --   --  0.5  GENTPEAK  --   --   --   --   --   --  3.4*  --     Estimated Creatinine Clearance: 131 mL/min (by C-G formula based on SCr of 0.65 mg/dL).    Allergies  Allergen Reactions   Ampicillin Rash    diffuse maculopapular rash - added to allergy list per Dr. Drue Second   Ceftriaxone Rash    diffuse maculopapular rash - added to allergy list per Dr. Drue Second    Antimicrobials this admission: Vancomycin 11/10 >> Gentamicin 11/11 >>  Dose adjustments this admission:   Microbiology  results: 10/30 BCx >> 4/4 E faecalis (pan-sensitive) 11/1 BCx >> NgF  Thank you for allowing pharmacy to be a part of this patient's care.  Georgina Pillion, PharmD, BCPS, BCIDP Infectious Diseases Clinical Pharmacist 11/16/2022 7:46 AM   **Pharmacist phone directory can now be found on [email or web address] ([email or web address]).  Listed under [facility].

## 2022-11-16 NOTE — Progress Notes (Signed)
[facility] Progress Note  Patient: Rick Taylor ZHY:[id number] DOB: [birth year] DOA: 10/27/2022  DOS: the patient was seen and examined on 11/16/2022  Brief hospital course:  Patient is a 32 years old male with past medical history of  polysubstance abuse presented to the hospital with complaints of right leg pain. Currently being treated for Enterococcus bacteremia as well as right leg cellulitis. 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 Cardiology and cardiothoracic surgery (Dr. Leafy Ro) felt that the patient is not a surgical candidate.  Current plan is supportive care with IV antibiotics. Eventually need to be considered for debridement of right leg wound and reconsideration for CT surgery evaluation as he clinically improves and hopefully off substance use.  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 TTE shows evidence of preserved EF but torrential AI and severe MR with LV dilation. Cardiology was formally consulted, currently signed off.  CT surgery reviewed the chart and recommended that the patient is not a surgical candidate for now due to ongoing heroin use Plan for reevaluation by CT surgery for consideration for surgical intervention as outpatient. Due to likely drug rash, IV ampicillin and ceftriaxone were discontinued on 11/10 per ID, on IV Vancomycin and  gentamicin currently.EOT December 13, 2022.  Mild leukocytosis persists  Likely drug rash Off ceftriaxone/ampicillin, Atarax and Claritin.  Improving.  Acute on chronic diastolic CHF Bilateral pleural effusion In the setting of severe MR and AI.  CT scan of the chest showed bilateral pleural effusion. Improved at this time.  Continue p.o. Lasix  Hypokalemia Improved.   Mild hyponatremia.  Asymptomatic.  Latest sodium of 128.  On oral Lasix.  Will  continue to monitor.   Right foot cellulitis MRI with no evidence of osteomyelitis. Orthopedic rec local wound care/conservative  May require local debridement once more stable.  Currently on wound care.  Polysubstance abuse Tobacco abuse Fentanyl IV abuse as outpatient.  Urine drug screen was positive for amphetamine, opiates, cocaine, cannabis.  (UDS performed after receiving opiates in the hospital). Patient is willing to quit  Continue Suboxone, clonidine, clonazepam, ativan as needed. Continue BuSpar,  nicotine patch  Attempt for substance use in the hospital Visitation restricted  Elevated LFT Coagulopathy- elevated INR Possible from bacteremia S/p Vit K.  INR is at 1.3 today   Anemia of chronic disease Hemoglobin improved to 8.3 after 1 unit of transfusion for 6.9. Fibrinogen normal.  INR 1.5. Posttransfusion H&H is stable.  Latest hemoglobin of 8.4   Leukocytosis Likely 2/2 bacteremia.  Will continue to monitor.  Afebrile at this time.  Temperature max of 98.9 F  Constipation CT abdomen was negative.  Continue bowel regimen.   Moderate protein calorie malnutrition  Nutrition Problem: Moderate Malnutrition Etiology: social / environmental circumstances Nutrition Interventions: Interventions: Refer to RD note for recommendations Body mass index is 20.51 kg/m.    Goals of care conversation Multiple discussion was held by the previous provider with the patient with regards to his goals of care per previous provider Dr Allena Katz.  Per CT surgery " per Dr. Leafy Ro, consulting CT surgeon: Ongoing heroin use and a chronic wound that would need to be dealt with way before he would ever be considered a surgical candidate and also would need to be off heroine" Per cardiology recommended palliative care and DNR. Patient currently understands this and has been transitioned to DNR  DNI.     Family communication.  Tried to reach the patient's mother on the phone but was unable to reach  on 11/14   Subjective Today, patient was seen and examined at bedside.  Patient denies interval complaints.  Denies any shortness of breath fever chills or rigor.  Rash is improving    Physical Exam:    11/16/2022    9:23 AM 11/16/2022    5:00 AM 11/16/2022    1:03 AM  Vitals with BMI  Systolic 123 124 161  Diastolic 55 56 48  Pulse 111  096    Body mass index is 20.51 kg/m.   General:  Average built, not in obvious distress HENT:   No scleral pallor or icterus noted. Oral mucosa is moist.  Chest:  Clear breath sounds.   No crackles or wheezes.  CVS: S1 &S2 heard.  Murmur noted, regular rate and rhythm. Abdomen: Soft, nontender, nondistended.  Bowel sounds are heard.   Extremities: No cyanosis, clubbing or edema.  Lower extremity with dressing.   Psych: Alert, awake and oriented, normal mood CNS:  No cranial nerve deficits.  Power equal in all extremities.   Skin: Warm and dry.  Diffuse macular rash noted over the body with some scaling   Disposition: Status is: Inpatient  Remains inpatient appropriate because: Need IV antibiotics.   Family Communication: None at bedside  Level of care: Progressive      Joycelyn Das, MD 11/16/2022 11:10 AM

## 2022-11-17 NOTE — Progress Notes (Signed)
Summary note  Notified by central telemetry @ 2224 to check patient  due to reading asystole. Upon entering room noted patient just staring blankly. When name called no response. Upon further entry and assessment noted no radial pulse. Upon listening no apical pulse or breath sounds noted. Did sternal rub while calling name without response. Did note one gasping breath. Called for charge nurse to verify assessment. Notified telemetry @ 2230 to run a final strip. 2235 notified A. Virgel Manifold, NP of patient condition. 2250 A. Virgel Manifold, NP arrived to unit to evaluate.  Virgel Manifold, NP to notify family. Will follow up per policy.

## 2022-11-17 NOTE — Progress Notes (Incomplete)
    Patient Name: Rick Taylor           DOB: [birth year]  MRN: [id number]       OVERNIGHT EVENT    Notified by RN that patient has expired at 2230. 2 RN verified.   Patient was DNR.   I spoke to RN at bedside.  She informs me that earlier this evening, patient had some dyspnea with exertion that was treated with O2 and neb tx. Patient had expressed some anxiety and was giving his scheduled bedtime meds.  No other complications were reported tonight.  Shortly after meds were given, central telemetry monitoring service alerted that patient was in "asystole."  Patient was found pulseless and not breathing.  I have made several attempts to reach primary contact (mother [name]) and have been unsuccessful.  Due to this unforseen and unfortunate event, I decided to call the patient's next emergency contact, [name] (aunt). Ms. [name] was unaware that the patient was hospitalized. She is tearful and shocked to hear this news, will need chaplin service available for family.  Ms. [name] is also trying to help [place] get in contact with Ms. [name]. Family plans to come see patient.     Anthoney Harada, DNP, ACNPC- AG [facility]

## 2022-11-17 NOTE — Plan of Care (Signed)
  Problem: Education: Goal: Knowledge of General Education information will improve Description: Including pain rating scale, medication(s)/side effects and non-pharmacologic comfort measures Outcome: Progressing   Problem: Health Behavior/Discharge Planning: Goal: Ability to manage health-related needs will improve Outcome: Progressing   Problem: Clinical Measurements: Goal: Ability to maintain clinical measurements within normal limits will improve Outcome: Progressing   Problem: Nutrition: Goal: Adequate nutrition will be maintained Outcome: Progressing   Problem: Pain Management: Goal: General experience of comfort will improve Outcome: Progressing   Problem: Education: Goal: Ability to state activities that reduce stress will improve Outcome: Progressing

## 2022-11-17 NOTE — Progress Notes (Signed)
Pt c/o light headed feeling on toilet. Ambulated pt back to bed. BP (!) 120/41 (BP Location: Right Arm)   Pulse (!) 101   Temp (!) 97.5 F (36.4 C) (Axillary)   Resp 20   Ht 6\' 1"  (1.854 m)   Wt 70.5 kg   SpO2 98%   BMI 20.51 kg/m  Pt put on oxygen 2L for comfort. Pt claiming can't breath. Lung sounds clear without congestion. Pt given PRN nebulizer, anxiety medication and scheduled muscle relaxer. Pt encouraged to deep breathe.  No other needs voiced at this time. Reva Bores 11/16/2022 6:34 PM

## 2022-11-17 NOTE — Progress Notes (Signed)
[facility] Progress Note  Patient: Rick Taylor ZOX:[id number] DOB: [birth year] DOA: 10/28/2022  DOS: the patient was seen and examined on 11/03/2022  Brief hospital course:  Patient is a 32 years old male with past medical history of  polysubstance abuse presented to the hospital with complaints of right leg pain. Currently being treated for Enterococcus bacteremia as well as right leg cellulitis. 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 Cardiology and cardiothoracic surgery (Dr. Leafy Ro) felt that the patient is not a surgical candidate.  Current plan is supportive care with IV antibiotics. Eventually need to be considered for debridement of right leg wound and reconsideration for CT surgery evaluation as he clinically improves and hopefully off substance use.  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 TTE shows evidence of preserved EF but torrential AI and severe MR with LV dilation. Cardiology was formally consulted, currently signed off.  CT surgery reviewed the chart and recommended that the patient is not a surgical candidate for now due to ongoing heroin use Plan for reevaluation by CT surgery for consideration for surgical intervention as outpatient. Due to likely drug rash, IV ampicillin and ceftriaxone were discontinued on 11/10 per ID, on IV Vancomycin and  gentamicin currently. EOT December 13, 2022.  Mild leukocytosis persists ID following.  Likely drug rash Off ceftriaxone/ampicillin, Atarax and Claritin.  Improving.  Acute on chronic diastolic CHF Bilateral pleural effusion In the setting of severe MR and AI.  CT scan of the chest showed bilateral pleural effusion. Improved at this time.  Continue p.o. Lasix  Hypokalemia Improved.  Latest potassium of 3.9.  Mild hyponatremia.  Asymptomatic.  Latest  sodium of 128.  On oral Lasix.  Will continue to monitor.   Right leg ellulitis MRI with no evidence of osteomyelitis. Orthopedic rec local wound care/conservative treatment. May require local debridement once more stable.  Currently on wound care.  Will reach out to orthopedics due to in AM.  Polysubstance abuse Tobacco abuse Fentanyl IV abuse as outpatient.  Urine drug screen was positive for amphetamine, opiates, cocaine, cannabis.  (UDS performed after receiving opiates in the hospital). Patient is willing to quit  Continue Suboxone, clonidine, clonazepam, ativan as needed. Continue BuSpar,  nicotine patch  Attempt for substance use in the hospital Visitation restricted  Elevated LFT Coagulopathy- elevated INR Possible from bacteremia S/p Vit K.  INR is at 1.3 today   Anemia of chronic disease Hemoglobin improved to 8.3 after 1 unit of transfusion for 6.9. Fibrinogen normal.  INR 1.5. Posttransfusion H&H is stable.  Latest hemoglobin of 8.4   Leukocytosis Likely 2/2 bacteremia.  Will continue to monitor.  Afebrile at this time.  Temperature max of 98.9 F  Constipation CT abdomen was negative.  Continue bowel regimen.   Moderate protein calorie malnutrition  Nutrition Problem: Moderate Malnutrition Etiology: social / environmental circumstances Nutrition Interventions: Interventions: Refer to RD note for recommendations Body mass index is 20.51 kg/m.    Goals of care conversation DNR/DNI.  Family communication.  Tried to reach the patient's mother on the phone but was unable to reach on 11/14   Subjective Today, patient was seen and examined at bedside.  Patient denies interval complaints.  Denies any nausea vomiting fever chills or rigor.    Physical Exam:    11/08/2022    6:08 AM 11/04/2022   12:00 AM 11/16/2022  8:34 PM  Vitals with BMI  Systolic 124  123  Diastolic 56  54  Pulse 103 105 108    Body mass index is 20.51 kg/m.   General:  Average  built, not in obvious distress, Communicative HENT:   No scleral pallor or icterus noted. Oral mucosa is moist.  Chest:  Clear breath sounds.   No crackles or wheezes.  CVS: S1 &S2 heard.  Murmur noted, regular rate and rhythm. Abdomen: Soft, nontender, nondistended.  Bowel sounds are heard.   Extremities: No cyanosis, clubbing or edema.  Right lower extremity with dressing.   Psych: Alert, awake and oriented, normal mood CNS:  No cranial nerve deficits.  Power equal in all extremities.   Skin: Warm and dry.  Diffuse macular rash noted over the body with some scaling, improving   Disposition: Status is: Inpatient  Remains inpatient appropriate because: Need IV antibiotics.   Family Communication: None at bedside  Level of care: Progressive      Joycelyn Das, MD 11/30/2022 10:13 AM

## 2022-12-02 NOTE — Progress Notes (Signed)
Summary note  [phone] charge nurse attempted another call to mother Rick Taylor; unsuccessful. This was the 6th or 7th attempt including calls from NP [facility].  [id number] Mom Rick Taylor returned phone call. Immediately reached out to NP Virgel Manifold to speak with her via telephone. Phone call transferred to NP per her request to speak with mother. 0440 post mortem care completed. All lines and dressings removed, gown and linens changed as well as wiping away any leaked body fluids. Shroud applied and folded down under covers. 54 Mother Rick Taylor, aunt Rick Taylor and other family members arrived to bedside.

## 2022-12-02 NOTE — Discharge Summary (Signed)
DEATH SUMMARY   Patient Details  Name: Rick Taylor MRN: [id number] DOB: Oct 18, 1990 NGE:[id number], No Pcp Per  Admission/Discharge Information   Admit Date:  11/07/2022  Date of Death: Date of Death: 11-25-2022  Time of Death: Time of Death: Dec 02, 2228  Length of Stay: 11/27/22   Principle Cause of death: Infective endocarditis  Hospital Diagnoses: Principal Problem:   Sepsis due to cellulitis [facility]) Active Problems:   Protein-calorie malnutrition, severe (HCC)   Hyponatremia   Abnormal LFTs   Microcytic anemia   Hyperglycemia   Tobacco abuse   Malnutrition of moderate degree   Lower extremity injury   Cellulitis of right lower extremity   Acute opioid withdrawal (HCC) Infective endocarditis  Hospital Course: Patient is a 32 years old male with past medical history of polysubstance abuse presented to the hospital with complaints of right leg pain. Currently being treated for Enterococcus bacteremia as well as right leg cellulitis. 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 Cardiology and cardiothoracic surgery (Dr. Leafy Ro) felt that the patient is not a surgical candidate. Current plan is supportive care with IV antibiotics. Eventually need to be considered for debridement of right leg wound and reconsideration for CT surgery evaluation as he clinically improves and hopefully off substance use.   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 TTE shows evidence of preserved EF but torrential AI and severe MR with LV dilation. Cardiology was formally consulted but signed off.  CT surgery reviewed the chart and recommended that the patient is not a surgical candidate for now due to ongoing heroin use. Plan for reevaluation by CT surgery for consideration for surgical intervention as outpatient.  Patient was continued on vancomycin and  gentamicin for infective endocarditis during hospitalization.  Likely drug rash Taken off Rocephin and ampicillin due to disease and was changed to vancomycin and gentamicin.   Acute on chronic diastolic CHF Bilateral pleural effusion In the setting of severe MR and AI.  CT scan of the chest showed bilateral pleural effusion. CHF was improving during hospitalization on oral Lasix.   Hypokalemia Improved.  Latest potassium of 3.9.   Mild hyponatremia.  Asymptomatic.  Latest sodium of 128.  On oral Lasix.  Will continue to monitor.  Polysubstance abuse. Patient with fentanyl IV abuse, his urine drug screen also tested positive for amphetamine, opiates, cocaine, cannabis.  (UDS performed after receiving opiates in the hospital).  He reported 3-4 times fentanyl use at home.  Was on Suboxone clonidine Klonopin  BuSpar  Acute on chronic diastolic CHF. In the setting of severe MR and AI.  Cardiology saw the patient during hospitalization was on oral diuretic.  Patient was deemed not a surgical candidate for intervention   Right foot cellulitis.   No evidence of osteomyelitis/polymyositis/abscess/fasciitis. MRI resulted in 11/2.  Reassuring for no evidence of osteomyelitis. Treated conservatively until patient was stable.  Attempt for substance use in the hospital. Patient was caught attempting to inject substance in the hospital on 10/31. Security was called in, multiple syringes and needles were identified in his personal belongings as well as different substance. Visitation was restricted.  Hyponatremia. Metabolic acidosis. Elevated LFT.  Anemia of chronic disease. Hemoglobin dropped down to 6.9. Requiring blood transfusion. Subsequently emained stable  Thrombocytopenia.  Moderate protein calorie malnutrition  Present on admission.  Goals of care   DNR/DNI during hospitalization.   Coagulopathy.  Constipation CT were negative.  Bilateral pleural effusion. CT chest  showed evidence of bilateral pleural effusion.  Was on diuretics.  The results of significant diagnostics from this hospitalization (including imaging, microbiology, ancillary and laboratory) are listed below for reference.   Significant Diagnostic Studies: DG CHEST PORT 1 VIEW  Result Date: 11/07/2022 CLINICAL DATA:  Acute on chronic diastolic heart failure. EXAM: PORTABLE CHEST 1 VIEW COMPARISON:  November 04, 2022. FINDINGS: Stable cardiomediastinal silhouette. Mildly decreased bilateral lung opacities are noted suggesting improving edema or atelectasis as well as decreased pleural effusions. Bony thorax is unremarkable. IMPRESSION: Mildly decreased bilateral lung opacities as noted above. Electronically Signed   By: Lupita Raider M.D.   On: 11/07/2022 09:41   DG CHEST PORT 1 VIEW  Result Date: 11/04/2022 CLINICAL DATA:  Congestive heart failure EXAM: PORTABLE CHEST 1 VIEW COMPARISON:  Chest x-ray dated November 02, 2022 FINDINGS: Cardiac and mediastinal contours are unchanged. Central predominant heterogeneous opacities, unchanged. Right-greater-than-left small to moderate pleural effusions, unchanged. IMPRESSION: 1. Central predominant heterogeneous opacities, unchanged, likely due to pulmonary edema. 2. Right-greater-than-left small to moderate pleural effusions, unchanged. Electronically Signed   By: Allegra Lai M.D.   On: 11/04/2022 09:36   CT CHEST ABDOMEN PELVIS W CONTRAST  Result Date: 11/03/2022 CLINICAL DATA:  Sepsis EXAM: CT CHEST, ABDOMEN, AND PELVIS WITH CONTRAST TECHNIQUE: Multidetector CT imaging of the chest, abdomen and pelvis was performed following the standard protocol during bolus administration of intravenous contrast. RADIATION DOSE REDUCTION: This exam was performed according to the departmental dose-optimization program which includes automated exposure control, adjustment of the mA and/or kV according to patient size and/or use of iterative reconstruction technique.  CONTRAST:  OMNIPAQUE IOHEXOL 300 MG/ML  SOLN COMPARISON:  None Available. FINDINGS: CT CHEST FINDINGS Cardiovascular: Heart is normal size.  Aorta normal caliber. Mediastinum/Nodes: No mediastinal, hilar, or axillary adenopathy. Trachea and esophagus are unremarkable. Thyroid unremarkable. Lungs/Pleura: Large bilateral pleural effusions. Compressive atelectasis in the lower lobes bilaterally. Ground-glass airspace opacities throughout the upper lobes compatible with pneumonia. Musculoskeletal: Chest wall soft tissues are unremarkable. No acute bony abnormality. CT ABDOMEN PELVIS FINDINGS Hepatobiliary: Insert paddle biliary Pancreas: No focal abnormality or ductal dilatation. Spleen: Normal size. Low-density areas throughout the spleen, most notable along the inferior pole concerning for splenic infarcts. Adrenals/Urinary Tract: Wedge-shaped low-density areas in the lower pole of the right kidney concerning for infarcts. No stones or hydronephrosis. Adrenal glands and urinary bladder unremarkable. Stomach/Bowel: Stomach, large and small bowel grossly unremarkable. Vascular/Lymphatic: No evidence of aneurysm or adenopathy. Reproductive: No visible focal abnormality. Other: Diffuse edema throughout the subcutaneous soft tissues and mesentery. Musculoskeletal: No acute bony abnormalities. IMPRESSION: Large bilateral pleural effusions with compressive atelectasis in the lower lobes. Ground-glass opacities in the upper lobes concerning for pneumonia. Areas of low-density in the spleen most notable in the lower pole and in the lower pole of the right kidney most compatible with infarcts. Diffuse edema throughout the subcutaneous soft tissues and mesentery. Electronically Signed   By: Charlett Nose M.D.   On: 11/03/2022 19:22   MR TIBIA FIBULA RIGHT W WO CONTRAST  Result Date: 11/03/2022 CLINICAL DATA:  Chronic wound, currently bacteremic. Looking for osteomyelitis. Reported nonhealing wound for 1 year. EXAM: MRI  OF LOWER RIGHT EXTREMITY WITHOUT AND WITH CONTRAST TECHNIQUE: Multiplanar, multisequence MR imaging of the right lower leg was performed both before and after administration of intravenous contrast. CONTRAST:  7mL GADAVIST GADOBUTROL 1 MMOL/ML IV SOLN COMPARISON:  Radiographs 10/31/2022 and 02/16/2020. No  prior cross-sectional imaging. FINDINGS: Bones/Joint/Cartilage There is no evidence of acute fracture, dislocation or osteomyelitis within right lower leg. The right knee joint is not included. The ankle joint appears unremarkable, without arthropathy or effusion. Ligaments Not relevant for exam/indication. Muscles and Tendons Mild nonspecific edema within the right lower leg musculature, primarily in the deep posterior compartment. No focal muscular atrophy on T1 weighted images. No abnormal enhancement following contrast. The visualized right patellar tendon is intact. The visualized ankle tendons are intact, although their distal insertions are not included. Mild nonspecific peroneal and posterior tibialis tenosynovitis. Soft tissues There are apparent areas of soft tissue ulceration both anteriorly and posterolaterally in the distal right lower leg. There is generalized dermal thickening and subcutaneous edema throughout the right lower leg. Postcontrast images demonstrate diffuse subcutaneous enhancement anteriorly in the distal right lower leg. No focal fluid collection is identified. Incidental imaging of the left lower leg reveals similar if not greater diffuse subcutaneous edema. IMPRESSION: 1. No evidence of osteomyelitis or abscess within the right lower leg. 2. Generalized dermal thickening and subcutaneous edema throughout the right lower leg with apparent areas of soft tissue ulceration both anteriorly and posterolaterally in the distal right lower leg. The soft tissue findings are nonspecific given their bilateral nature, but could reflect superficial soft tissue infection (cellulitis). 3. Incidental  imaging of the left lower leg reveals similar if not greater diffuse subcutaneous edema. 4. Mild nonspecific edema within the right lower leg musculature, primarily in the deep posterior compartment. No focal muscular atrophy. 5. Mild nonspecific peroneal and posterior tibialis tenosynovitis. Electronically Signed   By: Carey Bullocks M.D.   On: 11/03/2022 11:12   ECHOCARDIOGRAM COMPLETE  Result Date: 11/27/2022    ECHOCARDIOGRAM REPORT   Patient Name:   Rick Taylor Date of Exam: 11/22/2022 Medical Rec #:  [id number]    Height:       73.0 in Accession #:    [id number]   Weight:       155.4 lb Date of Birth:  1990-08-09     BSA:          1.933 m Patient Age:    32 years     BP:           123/75 mmHg Patient Gender: M            HR:           100 bpm. Exam Location:  Inpatient Procedure: 2D Echo, Cardiac Doppler and Color Doppler REPORT CONTAINS CRITICAL RESULT Indications:    Bacteremia  History:        Patient has no prior history of Echocardiogram examinations.                 Polysubstance abuse, IVDU.  Sonographer:    Milda Smart Referring Phys: [id number] PRANAV M PATEL  Sonographer Comments: Image acquisition challenging due to respiratory motion and Image acquisition challenging due to uncooperative patient. IMPRESSIONS  1. Left ventricular ejection fraction, by estimation, is 55%. The left ventricle has no regional wall motion abnormalities. The left ventricular internal cavity size was severely dilated. Left ventricular diastolic parameters are indeterminate. Elevated  left ventricular end-diastolic pressure.  2. Right ventricular systolic function moderate to severely reduced. The right ventricular size is mildly enlarged.  3. Left atrial size was mild to moderately dilated.  4. Right atrial size was mildly dilated.  5. A small pericardial effusion is present. Large pleural effusion.  6. Moderate to severe mitral valve regurgitation.  7.  AV is thickened, leaflets appear shaggy There appears to be a  vegetation on left coronary cusp with destruction of the normal leaflet function. Leaflets do not coapt. Deceleration time is very short at 96 msec. There is holosystolic backflow from the  descending aorta. Overall consistent with torrential AI.Marland Kitchen The aortic valve is tricuspid. Aortic valve regurgitation is severe. Aortic regurgitation PHT measures 96 msec.  8. The inferior vena cava is dilated in size with >50% respiratory variability, suggesting right atrial pressure of 8 mmHg. FINDINGS  Left Ventricle: Left ventricular ejection fraction, by estimation, is 55%. The left ventricle has no regional wall motion abnormalities. The left ventricular internal cavity size was severely dilated. There is no left ventricular hypertrophy. Left ventricular diastolic parameters are indeterminate. Elevated left ventricular end-diastolic pressure. Right Ventricle: The right ventricular size is mildly enlarged. Right vetricular wall thickness was not assessed. Right ventricular systolic function moderate to severely reduced. Left Atrium: Left atrial size was mild to moderately dilated. Right Atrium: Right atrial size was mildly dilated. Pericardium: A small pericardial effusion is present. Mitral Valve: There is mild thickening of the mitral valve leaflet(s). Moderate to severe mitral valve regurgitation. Tricuspid Valve: The tricuspid valve is normal in structure. Tricuspid valve regurgitation is mild. Aortic Valve: AV is thickened, leaflets appear shaggy There appears to be a vegetation on left coronary cusp with destruction of the normal leaflet function. Leaflets do not coapt. Deceleration time is very short at 96 msec. There is holosystolic backflow from the descending aorta. Overall consistent with torrential AI. The aortic valve is tricuspid. Aortic valve regurgitation is severe. Aortic regurgitation PHT measures 96 msec. Pulmonic Valve: The pulmonic valve was grossly normal. Pulmonic valve regurgitation is mild. Aorta: The  aortic root and ascending aorta are structurally normal, with no evidence of dilitation. Venous: The inferior vena cava is dilated in size with greater than 50% respiratory variability, suggesting right atrial pressure of 8 mmHg. IAS/Shunts: No atrial level shunt detected by color flow Doppler. Additional Comments: There is a large pleural effusion.  LEFT VENTRICLE PLAX 2D LVIDd:         6.20 cm LVIDs:         3.80 cm LV PW:         1.00 cm LV IVS:        0.70 cm LVOT diam:     2.40 cm LVOT Area:     4.52 cm  IVC IVC diam: 2.90 cm LEFT ATRIUM             Index        RIGHT ATRIUM           Index LA diam:        5.00 cm 2.59 cm/m   RA Area:     16.90 cm LA Vol (A2C):   64.8 ml 33.52 ml/m  RA Volume:   51.10 ml  26.44 ml/m LA Vol (A4C):   72.6 ml 37.56 ml/m LA Biplane Vol: 70.3 ml 36.37 ml/m  AORTIC VALVE AI PHT:      96 msec  AORTA Ao Root diam: 3.00 cm Ao Asc diam:  3.20 cm MR Peak grad: 88.4 mmHg   TRICUSPID VALVE MR Vmax:      470.00 cm/s TR Peak grad:   17.5 mmHg                           TR Vmax:        209.00  cm/s                            SHUNTS                           Systemic Diam: 2.40 cm Dietrich Pates MD Electronically signed by Dietrich Pates MD Signature Date/Time: 11/06/2022/9:34:49 AM    Final    DG Chest Port 1 View  Result Date: 11/09/2022 CLINICAL DATA:  [id number] shortness of breath.  History of drug use. EXAM: PORTABLE CHEST 1 VIEW COMPARISON:  PA Lat 10/02/2014 FINDINGS: Interval slight enlargement of the cardiac silhouette. The central vessels are probably normal in caliber but they are not well seen due to central airspace disease. There is mild-to-moderate interstitial edema with a basal gradient and at least small-sized underlying layering pleural effusions. There are perihilar and bibasilar patchy opacities consistent with alveolar edema, pneumonia or combination. The upper 1/3 of the lungs remain clear. The mediastinum is normally outlined. Thoracic cage is intact.  There is overlying  monitor wiring. IMPRESSION: 1. Interval slight enlargement of the cardiac silhouette. 2. Mild-to-moderate interstitial edema with a basal gradient and at least small-sized underlying layering pleural effusions. 3. Perihilar and bibasilar patchy opacities consistent with alveolar edema/ARDS, pneumonia or combination. Electronically Signed   By: Almira Bar M.D.   On: 11/23/2022 01:31   VAS [place] ABI WITH/WO TBI  Result Date: 10/31/2022  LOWER EXTREMITY DOPPLER STUDY Patient Name:  Rick Taylor  Date of Exam:   10/31/2022 Medical Rec #: [id number]     Accession #:    [id number] Date of Birth: 11-20-1990      Patient Gender: M Patient Age:   40 years Exam Location:  [facility] Procedure:      VAS [place] ABI WITH/WO TBI Referring Phys: DAVID ORTIZ --------------------------------------------------------------------------------  Indications: Ulceration, and gangrene. High Risk Factors: None.  Limitations: Today's exam was limited due to an open wound, bandages and              involuntary patient movement. Comparison Study: No prior studies. Performing Technologist: Olen Cordial RVT  Examination Guidelines: A complete evaluation includes at minimum, Doppler waveform signals and systolic blood pressure reading at the level of bilateral brachial, anterior tibial, and posterior tibial arteries, when vessel segments are accessible. Bilateral testing is considered an integral part of a complete examination. Photoelectric Plethysmograph (PPG) waveforms and toe systolic pressure readings are included as required and additional duplex testing as needed. Limited examinations for reoccurring indications may be performed as noted.  ABI Findings: +---------+------------------+-----+-----------+--------+ Right    Rt Pressure (mmHg)IndexWaveform   Comment  +---------+------------------+-----+-----------+--------+ Brachial 115                    triphasic            +---------+------------------+-----+-----------+--------+ PTA      208               1.81 multiphasic         +---------+------------------+-----+-----------+--------+ DP       217               1.89 multiphasic         +---------+------------------+-----+-----------+--------+ Great Toe69                0.60                     +---------+------------------+-----+-----------+--------+ +--------+------------------+-----+-----------+-------+  Left    Lt Pressure (mmHg)IndexWaveform   Comment +--------+------------------+-----+-----------+-------+ [id number]                    triphasic          +--------+------------------+-----+-----------+-------+ PTA     210               1.83 multiphasic        +--------+------------------+-----+-----------+-------+ DP      196               1.70 multiphasic        +--------+------------------+-----+-----------+-------+ +-------+-----------+-----------+------------+------------+ ABI/TBIToday's ABIToday's TBIPrevious ABIPrevious TBI +-------+-----------+-----------+------------+------------+ Right  1.89       0.6                                 +-------+-----------+-----------+------------+------------+ Left   1.83                                           +-------+-----------+-----------+------------+------------+   Summary: Right: Resting right ankle-brachial index indicates noncompressible right lower extremity arteries. The right toe-brachial index is abnormal. Left: Resting left ankle-brachial index indicates noncompressible left lower extremity arteries. Unable to obtain TBI due to inconsistent waveforms. *See table(s) above for measurements and observations.  Electronically signed by Sherald Hess MD on 10/31/2022 at 3:55:19 PM.    Final    DG Tibia/Fibula Right  Result Date: 10/31/2022 CLINICAL DATA:  32 year old male with gaping wound overlying the lower tibia and fibula. Evaluate for osteomyelitis. EXAM:  RIGHT TIBIA AND FIBULA - 2 VIEW COMPARISON:  02/16/2020. FINDINGS: There is no evidence of fracture or other focal bone lesions. Soft tissues are unremarkable. IMPRESSION: Negative. Electronically Signed   By: Trudie Reed M.D.   On: 10/31/2022 05:42    Microbiology: No results found for this or any previous visit (from the past 240 hour(s)).   Signed: Joycelyn Das, MD 11/19/22

## 2022-12-02 NOTE — Progress Notes (Signed)
Expired patient transported to morgue at this time.

## 2022-12-02 NOTE — Progress Notes (Incomplete)
DEATH SUMMARY   Patient Details  Name: Rick Taylor MRN: [id number] DOB: May 04, 1990 [id number], No Pcp Per  Admission/Discharge Information   Admit Date:  2022/11/23  Date of Death: Date of Death: 12-11-2022  Time of Death: Time of Death: 2228/12/18  Length of Stay: Dec 13, 2022   Principle Cause of death: Infective endocarditis  Hospital Diagnoses: Principal Problem:   Sepsis due to cellulitis [facility]) Active Problems:   Protein-calorie malnutrition, severe (HCC)   Hyponatremia   Abnormal LFTs   Microcytic anemia   Hyperglycemia   Tobacco abuse   Malnutrition of moderate degree   Lower extremity injury   Cellulitis of right lower extremity   Acute opioid withdrawal (HCC) Infective endocarditis  Hospital Course: Patient is a 32 years old male with past medical history of polysubstance abuse presented to the hospital with complaints of right leg pain. Currently being treated for Enterococcus bacteremia as well as right leg cellulitis. 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 Cardiology and cardiothoracic surgery (Dr. Leafy Ro) felt that the patient is not a surgical candidate. Current plan is supportive care with IV antibiotics. Eventually need to be considered for debridement of right leg wound and reconsideration for CT surgery evaluation as he clinically improves and hopefully off substance use.   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 TTE shows evidence of preserved EF but torrential AI and severe MR with LV dilation. Cardiology was formally consulted but signed off.  CT surgery reviewed the chart and recommended that the patient is not a surgical candidate for now due to ongoing heroin use. Plan for reevaluation by CT surgery for consideration for surgical intervention as outpatient.  Patient was continued on vancomycin and  gentamicin for infective endocarditis during hospitalization.  Likely drug rash Taken off Rocephin and ampicillin due to disease and was changed to vancomycin and gentamicin.   Acute on chronic diastolic CHF Bilateral pleural effusion In the setting of severe MR and AI.  CT scan of the chest showed bilateral pleural effusion. CHF was improving during hospitalization on oral Lasix.   Hypokalemia Improved.  Latest potassium of 3.9.   Mild hyponatremia.  Asymptomatic.  Latest sodium of 128.  On oral Lasix.  Will continue to monitor.  Polysubstance abuse. Patient with fentanyl IV abuse, his urine drug screen also tested positive for amphetamine, opiates, cocaine, cannabis.  (UDS performed after receiving opiates in the hospital).  He reported 3-4 times fentanyl use at home.  Was on Suboxone clonidine Klonopin  BuSpar  Acute on chronic diastolic CHF. In the setting of severe MR and AI.  Cardiology saw the patient during hospitalization was on oral diuretic.  Patient was deemed not a surgical candidate for intervention   Right foot cellulitis.   No evidence of osteomyelitis/polymyositis/abscess/fasciitis. MRI resulted in 11/2.  Reassuring for no evidence of osteomyelitis. Treated conservatively until patient was stable.  Attempt for substance use in the hospital. Patient was caught attempting to inject substance in the hospital on 10/31. Security was called in, multiple syringes and needles were identified in his personal belongings as well as different substance. Visitation was restricted.  Hyponatremia. Metabolic acidosis. Elevated LFT.  Anemia of chronic disease. Hemoglobin dropped down to 6.9. Requiring blood transfusion. Subsequently emained stable  Thrombocytopenia.  Moderate protein calorie malnutrition  Present on admission.  Goals of care   DNR/DNI during hospitalization.   Coagulopathy.  Constipation CT were negative.  Bilateral pleural effusion. CT chest  showed evidence of bilateral pleural effusion.  Was on diuretics.  The results of significant diagnostics from this hospitalization (including imaging, microbiology, ancillary and laboratory) are listed below for reference.   Significant Diagnostic Studies: DG CHEST PORT 1 VIEW  Result Date: 11/07/2022 CLINICAL DATA:  Acute on chronic diastolic heart failure. EXAM: PORTABLE CHEST 1 VIEW COMPARISON:  November 04, 2022. FINDINGS: Stable cardiomediastinal silhouette. Mildly decreased bilateral lung opacities are noted suggesting improving edema or atelectasis as well as decreased pleural effusions. Bony thorax is unremarkable. IMPRESSION: Mildly decreased bilateral lung opacities as noted above. Electronically Signed   By: Lupita Raider M.D.   On: 11/07/2022 09:41   DG CHEST PORT 1 VIEW  Result Date: 11/04/2022 CLINICAL DATA:  Congestive heart failure EXAM: PORTABLE CHEST 1 VIEW COMPARISON:  Chest x-ray dated November 02, 2022 FINDINGS: Cardiac and mediastinal contours are unchanged. Central predominant heterogeneous opacities, unchanged. Right-greater-than-left small to moderate pleural effusions, unchanged. IMPRESSION: 1. Central predominant heterogeneous opacities, unchanged, likely due to pulmonary edema. 2. Right-greater-than-left small to moderate pleural effusions, unchanged. Electronically Signed   By: Allegra Lai M.D.   On: 11/04/2022 09:36   CT CHEST ABDOMEN PELVIS W CONTRAST  Result Date: 11/03/2022 CLINICAL DATA:  Sepsis EXAM: CT CHEST, ABDOMEN, AND PELVIS WITH CONTRAST TECHNIQUE: Multidetector CT imaging of the chest, abdomen and pelvis was performed following the standard protocol during bolus administration of intravenous contrast. RADIATION DOSE REDUCTION: This exam was performed according to the departmental dose-optimization program which includes automated exposure control, adjustment of the mA and/or kV according to patient size and/or use of iterative reconstruction technique.  CONTRAST:  OMNIPAQUE IOHEXOL 300 MG/ML  SOLN COMPARISON:  None Available. FINDINGS: CT CHEST FINDINGS Cardiovascular: Heart is normal size.  Aorta normal caliber. Mediastinum/Nodes: No mediastinal, hilar, or axillary adenopathy. Trachea and esophagus are unremarkable. Thyroid unremarkable. Lungs/Pleura: Large bilateral pleural effusions. Compressive atelectasis in the lower lobes bilaterally. Ground-glass airspace opacities throughout the upper lobes compatible with pneumonia. Musculoskeletal: Chest wall soft tissues are unremarkable. No acute bony abnormality. CT ABDOMEN PELVIS FINDINGS Hepatobiliary: Insert paddle biliary Pancreas: No focal abnormality or ductal dilatation. Spleen: Normal size. Low-density areas throughout the spleen, most notable along the inferior pole concerning for splenic infarcts. Adrenals/Urinary Tract: Wedge-shaped low-density areas in the lower pole of the right kidney concerning for infarcts. No stones or hydronephrosis. Adrenal glands and urinary bladder unremarkable. Stomach/Bowel: Stomach, large and small bowel grossly unremarkable. Vascular/Lymphatic: No evidence of aneurysm or adenopathy. Reproductive: No visible focal abnormality. Other: Diffuse edema throughout the subcutaneous soft tissues and mesentery. Musculoskeletal: No acute bony abnormalities. IMPRESSION: Large bilateral pleural effusions with compressive atelectasis in the lower lobes. Ground-glass opacities in the upper lobes concerning for pneumonia. Areas of low-density in the spleen most notable in the lower pole and in the lower pole of the right kidney most compatible with infarcts. Diffuse edema throughout the subcutaneous soft tissues and mesentery. Electronically Signed   By: Charlett Nose M.D.   On: 11/03/2022 19:22   MR TIBIA FIBULA RIGHT W WO CONTRAST  Result Date: 11/03/2022 CLINICAL DATA:  Chronic wound, currently bacteremic. Looking for osteomyelitis. Reported nonhealing wound for 1 year. EXAM: MRI  OF LOWER RIGHT EXTREMITY WITHOUT AND WITH CONTRAST TECHNIQUE: Multiplanar, multisequence MR imaging of the right lower leg was performed both before and after administration of intravenous contrast. CONTRAST:  7mL GADAVIST GADOBUTROL 1 MMOL/ML IV SOLN COMPARISON:  Radiographs 10/31/2022 and 02/16/2020. No  prior cross-sectional imaging. FINDINGS: Bones/Joint/Cartilage There is no evidence of acute fracture, dislocation or osteomyelitis within right lower leg. The right knee joint is not included. The ankle joint appears unremarkable, without arthropathy or effusion. Ligaments Not relevant for exam/indication. Muscles and Tendons Mild nonspecific edema within the right lower leg musculature, primarily in the deep posterior compartment. No focal muscular atrophy on T1 weighted images. No abnormal enhancement following contrast. The visualized right patellar tendon is intact. The visualized ankle tendons are intact, although their distal insertions are not included. Mild nonspecific peroneal and posterior tibialis tenosynovitis. Soft tissues There are apparent areas of soft tissue ulceration both anteriorly and posterolaterally in the distal right lower leg. There is generalized dermal thickening and subcutaneous edema throughout the right lower leg. Postcontrast images demonstrate diffuse subcutaneous enhancement anteriorly in the distal right lower leg. No focal fluid collection is identified. Incidental imaging of the left lower leg reveals similar if not greater diffuse subcutaneous edema. IMPRESSION: 1. No evidence of osteomyelitis or abscess within the right lower leg. 2. Generalized dermal thickening and subcutaneous edema throughout the right lower leg with apparent areas of soft tissue ulceration both anteriorly and posterolaterally in the distal right lower leg. The soft tissue findings are nonspecific given their bilateral nature, but could reflect superficial soft tissue infection (cellulitis). 3. Incidental  imaging of the left lower leg reveals similar if not greater diffuse subcutaneous edema. 4. Mild nonspecific edema within the right lower leg musculature, primarily in the deep posterior compartment. No focal muscular atrophy. 5. Mild nonspecific peroneal and posterior tibialis tenosynovitis. Electronically Signed   By: Carey Bullocks M.D.   On: 11/03/2022 11:12   ECHOCARDIOGRAM COMPLETE  Result Date: 11/08/2022    ECHOCARDIOGRAM REPORT   Patient Name:   JAMEZ LEVEE Date of Exam: 11/26/2022 Medical Rec #:  [id number]    Height:       73.0 in Accession #:    [id number]   Weight:       155.4 lb Date of Birth:  October 30, 1990     BSA:          1.933 m Patient Age:    32 years     BP:           123/75 mmHg Patient Gender: M            HR:           100 bpm. Exam Location:  Inpatient Procedure: 2D Echo, Cardiac Doppler and Color Doppler REPORT CONTAINS CRITICAL RESULT Indications:    Bacteremia  History:        Patient has no prior history of Echocardiogram examinations.                 Polysubstance abuse, IVDU.  Sonographer:    Milda Smart Referring Phys: [id number] PRANAV M PATEL  Sonographer Comments: Image acquisition challenging due to respiratory motion and Image acquisition challenging due to uncooperative patient. IMPRESSIONS  1. Left ventricular ejection fraction, by estimation, is 55%. The left ventricle has no regional wall motion abnormalities. The left ventricular internal cavity size was severely dilated. Left ventricular diastolic parameters are indeterminate. Elevated  left ventricular end-diastolic pressure.  2. Right ventricular systolic function moderate to severely reduced. The right ventricular size is mildly enlarged.  3. Left atrial size was mild to moderately dilated.  4. Right atrial size was mildly dilated.  5. A small pericardial effusion is present. Large pleural effusion.  6. Moderate to severe mitral valve regurgitation.  7.  AV is thickened, leaflets appear shaggy There appears to be a  vegetation on left coronary cusp with destruction of the normal leaflet function. Leaflets do not coapt. Deceleration time is very short at 96 msec. There is holosystolic backflow from the  descending aorta. Overall consistent with torrential AI.Marland Kitchen The aortic valve is tricuspid. Aortic valve regurgitation is severe. Aortic regurgitation PHT measures 96 msec.  8. The inferior vena cava is dilated in size with >50% respiratory variability, suggesting right atrial pressure of 8 mmHg. FINDINGS  Left Ventricle: Left ventricular ejection fraction, by estimation, is 55%. The left ventricle has no regional wall motion abnormalities. The left ventricular internal cavity size was severely dilated. There is no left ventricular hypertrophy. Left ventricular diastolic parameters are indeterminate. Elevated left ventricular end-diastolic pressure. Right Ventricle: The right ventricular size is mildly enlarged. Right vetricular wall thickness was not assessed. Right ventricular systolic function moderate to severely reduced. Left Atrium: Left atrial size was mild to moderately dilated. Right Atrium: Right atrial size was mildly dilated. Pericardium: A small pericardial effusion is present. Mitral Valve: There is mild thickening of the mitral valve leaflet(s). Moderate to severe mitral valve regurgitation. Tricuspid Valve: The tricuspid valve is normal in structure. Tricuspid valve regurgitation is mild. Aortic Valve: AV is thickened, leaflets appear shaggy There appears to be a vegetation on left coronary cusp with destruction of the normal leaflet function. Leaflets do not coapt. Deceleration time is very short at 96 msec. There is holosystolic backflow from the descending aorta. Overall consistent with torrential AI. The aortic valve is tricuspid. Aortic valve regurgitation is severe. Aortic regurgitation PHT measures 96 msec. Pulmonic Valve: The pulmonic valve was grossly normal. Pulmonic valve regurgitation is mild. Aorta: The  aortic root and ascending aorta are structurally normal, with no evidence of dilitation. Venous: The inferior vena cava is dilated in size with greater than 50% respiratory variability, suggesting right atrial pressure of 8 mmHg. IAS/Shunts: No atrial level shunt detected by color flow Doppler. Additional Comments: There is a large pleural effusion.  LEFT VENTRICLE PLAX 2D LVIDd:         6.20 cm LVIDs:         3.80 cm LV PW:         1.00 cm LV IVS:        0.70 cm LVOT diam:     2.40 cm LVOT Area:     4.52 cm  IVC IVC diam: 2.90 cm LEFT ATRIUM             Index        RIGHT ATRIUM           Index LA diam:        5.00 cm 2.59 cm/m   RA Area:     16.90 cm LA Vol (A2C):   64.8 ml 33.52 ml/m  RA Volume:   51.10 ml  26.44 ml/m LA Vol (A4C):   72.6 ml 37.56 ml/m LA Biplane Vol: 70.3 ml 36.37 ml/m  AORTIC VALVE AI PHT:      96 msec  AORTA Ao Root diam: 3.00 cm Ao Asc diam:  3.20 cm MR Peak grad: 88.4 mmHg   TRICUSPID VALVE MR Vmax:      470.00 cm/s TR Peak grad:   17.5 mmHg                           TR Vmax:        209.00  cm/s                            SHUNTS                           Systemic Diam: 2.40 cm Dietrich Pates MD Electronically signed by Dietrich Pates MD Signature Date/Time: 11/29/2022/9:34:49 AM    Final    DG Chest Port 1 View  Result Date: 11/11/2022 CLINICAL DATA:  [id number] shortness of breath.  History of drug use. EXAM: PORTABLE CHEST 1 VIEW COMPARISON:  PA Lat 10/02/2014 FINDINGS: Interval slight enlargement of the cardiac silhouette. The central vessels are probably normal in caliber but they are not well seen due to central airspace disease. There is mild-to-moderate interstitial edema with a basal gradient and at least small-sized underlying layering pleural effusions. There are perihilar and bibasilar patchy opacities consistent with alveolar edema, pneumonia or combination. The upper 1/3 of the lungs remain clear. The mediastinum is normally outlined. Thoracic cage is intact.  There is overlying  monitor wiring. IMPRESSION: 1. Interval slight enlargement of the cardiac silhouette. 2. Mild-to-moderate interstitial edema with a basal gradient and at least small-sized underlying layering pleural effusions. 3. Perihilar and bibasilar patchy opacities consistent with alveolar edema/ARDS, pneumonia or combination. Electronically Signed   By: Almira Bar M.D.   On: 11/11/2022 01:31   VAS [place] ABI WITH/WO TBI  Result Date: 10/31/2022  LOWER EXTREMITY DOPPLER STUDY Patient Name:  Jayceeon Shrieves  Date of Exam:   10/31/2022 Medical Rec #: [id number]     Accession #:    [id number] Date of Birth: 29-Jan-1990      Patient Gender: M Patient Age:   31 years Exam Location:  [facility] Procedure:      VAS [place] ABI WITH/WO TBI Referring Phys: DAVID ORTIZ --------------------------------------------------------------------------------  Indications: Ulceration, and gangrene. High Risk Factors: None.  Limitations: Today's exam was limited due to an open wound, bandages and              involuntary patient movement. Comparison Study: No prior studies. Performing Technologist: Olen Cordial RVT  Examination Guidelines: A complete evaluation includes at minimum, Doppler waveform signals and systolic blood pressure reading at the level of bilateral brachial, anterior tibial, and posterior tibial arteries, when vessel segments are accessible. Bilateral testing is considered an integral part of a complete examination. Photoelectric Plethysmograph (PPG) waveforms and toe systolic pressure readings are included as required and additional duplex testing as needed. Limited examinations for reoccurring indications may be performed as noted.  ABI Findings: +---------+------------------+-----+-----------+--------+ Right    Rt Pressure (mmHg)IndexWaveform   Comment  +---------+------------------+-----+-----------+--------+ Brachial 115                    triphasic            +---------+------------------+-----+-----------+--------+ PTA      208               1.81 multiphasic         +---------+------------------+-----+-----------+--------+ DP       217               1.89 multiphasic         +---------+------------------+-----+-----------+--------+ Great Toe69                0.60                     +---------+------------------+-----+-----------+--------+ +--------+------------------+-----+-----------+-------+  Left    Lt Pressure (mmHg)IndexWaveform   Comment +--------+------------------+-----+-----------+-------+ [id number]                    triphasic          +--------+------------------+-----+-----------+-------+ PTA     210               1.83 multiphasic        +--------+------------------+-----+-----------+-------+ DP      196               1.70 multiphasic        +--------+------------------+-----+-----------+-------+ +-------+-----------+-----------+------------+------------+ ABI/TBIToday's ABIToday's TBIPrevious ABIPrevious TBI +-------+-----------+-----------+------------+------------+ Right  1.89       0.6                                 +-------+-----------+-----------+------------+------------+ Left   1.83                                           +-------+-----------+-----------+------------+------------+   Summary: Right: Resting right ankle-brachial index indicates noncompressible right lower extremity arteries. The right toe-brachial index is abnormal. Left: Resting left ankle-brachial index indicates noncompressible left lower extremity arteries. Unable to obtain TBI due to inconsistent waveforms. *See table(s) above for measurements and observations.  Electronically signed by Sherald Hess MD on 10/31/2022 at 3:55:19 PM.    Final    DG Tibia/Fibula Right  Result Date: 10/31/2022 CLINICAL DATA:  32 year old male with gaping wound overlying the lower tibia and fibula. Evaluate for osteomyelitis. EXAM:  RIGHT TIBIA AND FIBULA - 2 VIEW COMPARISON:  02/16/2020. FINDINGS: There is no evidence of fracture or other focal bone lesions. Soft tissues are unremarkable. IMPRESSION: Negative. Electronically Signed   By: Trudie Reed M.D.   On: 10/31/2022 05:42    Microbiology: No results found for this or any previous visit (from the past 240 hour(s)).   Signed: Joycelyn Das, MD 11/14/2022

## 2022-12-02 DEATH — deceased
